# Patient Record
Sex: Female | Born: 1975 | Race: White | Hispanic: Yes | Marital: Married | Smoking: Never smoker
Health system: Southern US, Community
[De-identification: ages and names within clinical notes are randomized; demographics above are authoritative.]

---

## 1999-12-29 ENCOUNTER — Ambulatory Visit (HOSPITAL_COMMUNITY): Admission: RE | Admit: 1999-12-29 | Discharge: 1999-12-29 | Payer: Self-pay | Admitting: *Deleted

## 2000-05-20 ENCOUNTER — Encounter (HOSPITAL_COMMUNITY): Admission: RE | Admit: 2000-05-20 | Discharge: 2000-05-24 | Payer: Self-pay | Admitting: Obstetrics & Gynecology

## 2000-05-25 ENCOUNTER — Inpatient Hospital Stay (HOSPITAL_COMMUNITY): Admission: AD | Admit: 2000-05-25 | Discharge: 2000-05-28 | Payer: Self-pay | Admitting: *Deleted

## 2000-07-13 ENCOUNTER — Emergency Department (HOSPITAL_COMMUNITY): Admission: EM | Admit: 2000-07-13 | Discharge: 2000-07-13 | Payer: Self-pay | Admitting: Emergency Medicine

## 2005-03-17 ENCOUNTER — Inpatient Hospital Stay (HOSPITAL_COMMUNITY): Admission: EM | Admit: 2005-03-17 | Discharge: 2005-03-20 | Payer: Self-pay | Admitting: Emergency Medicine

## 2017-07-17 ENCOUNTER — Other Ambulatory Visit: Payer: Self-pay

## 2017-07-17 ENCOUNTER — Emergency Department (HOSPITAL_COMMUNITY): Payer: Self-pay

## 2017-07-17 ENCOUNTER — Emergency Department (HOSPITAL_COMMUNITY)
Admission: EM | Admit: 2017-07-17 | Discharge: 2017-07-17 | Disposition: A | Discharge status: Home / Self Care | Payer: Self-pay | Attending: Emergency Medicine | Admitting: Emergency Medicine

## 2017-07-17 ENCOUNTER — Encounter (HOSPITAL_COMMUNITY): Payer: Self-pay | Admitting: Emergency Medicine

## 2017-07-17 DIAGNOSIS — R112 Nausea with vomiting, unspecified: Secondary | ICD-10-CM | POA: Insufficient documentation

## 2017-07-17 DIAGNOSIS — R0789 Other chest pain: Secondary | ICD-10-CM | POA: Insufficient documentation

## 2017-07-17 DIAGNOSIS — K2900 Acute gastritis without bleeding: Secondary | ICD-10-CM | POA: Insufficient documentation

## 2017-07-17 LAB — BASIC METABOLIC PANEL
Anion gap: 10 (ref 5–15)
BUN: 6 mg/dL (ref 6–20)
CHLORIDE: 100 mmol/L — AB (ref 101–111)
CO2: 24 mmol/L (ref 22–32)
CREATININE: 0.51 mg/dL (ref 0.44–1.00)
Calcium: 9.6 mg/dL (ref 8.9–10.3)
Glucose, Bld: 129 mg/dL — ABNORMAL HIGH (ref 65–99)
POTASSIUM: 3.8 mmol/L (ref 3.5–5.1)
SODIUM: 134 mmol/L — AB (ref 135–145)

## 2017-07-17 LAB — I-STAT BETA HCG BLOOD, ED (MC, WL, AP ONLY)

## 2017-07-17 LAB — CBC
HCT: 38.1 % (ref 36.0–46.0)
Hemoglobin: 12.9 g/dL (ref 12.0–15.0)
MCH: 30.1 pg (ref 26.0–34.0)
MCHC: 33.9 g/dL (ref 30.0–36.0)
MCV: 88.8 fL (ref 78.0–100.0)
PLATELETS: 364 10*3/uL (ref 150–400)
RBC: 4.29 MIL/uL (ref 3.87–5.11)
RDW: 13.9 % (ref 11.5–15.5)
WBC: 14 10*3/uL — AB (ref 4.0–10.5)

## 2017-07-17 LAB — I-STAT TROPONIN, ED
Troponin i, poc: 0 ng/mL (ref 0.00–0.08)
Troponin i, poc: 0 ng/mL (ref 0.00–0.08)

## 2017-07-17 MED ORDER — ONDANSETRON 4 MG PO TBDP
4.0000 mg | ORAL_TABLET | Freq: Once | ORAL | Status: AC
  Administered 2017-07-17: 4 mg via ORAL
  Filled 2017-07-17: qty 1

## 2017-07-17 MED ORDER — PANTOPRAZOLE SODIUM 20 MG PO TBEC: 20.0000 mg | DELAYED_RELEASE_TABLET | Freq: Every day | ORAL | 0 refills | Status: DC

## 2017-07-17 MED ORDER — RANITIDINE HCL 150 MG PO CAPS: 150.0000 mg | ORAL_CAPSULE | Freq: Every day | ORAL | 0 refills | Status: DC

## 2017-07-17 MED ORDER — GI COCKTAIL ~~LOC~~
30.0000 mL | Freq: Once | ORAL | Status: AC
  Administered 2017-07-17: 30 mL via ORAL
  Filled 2017-07-17: qty 30

## 2017-07-17 NOTE — ED Triage Notes (Signed)
Pt reports epigastric with n/v that started last night that feels like her stomach is 'full of air".

## 2017-07-17 NOTE — Discharge Instructions (Signed)
Your pain is likely from acid reflux, gastritis or an ulcer. You have low risk for cardiac problems.   Take medications as prescribed protonix once daily zantac one daily Carafate suspension with every meal.   Try to modify your diet. See diet attached.   Sometimes a bacterial infection (H. pylori) can cause gastritis and ulcers. Follow up with gastroenterologist if your symptoms do not improve in 2 weeks.   Return to ED if you have chest pain, shortness of breath, fevers, blood in vomit or feces   Su dolor es probable de reflujo cido, gastritis o una lcera. Tiene bajo riesgo de problemas cardacos.   Tome los [employer] que se le han recetado protonix una vez al da zantac uno diariamente suspensin de carafate con cada comida.   Intente modificar su dieta.   Una infeccin bacteriana (H. pylori) puede causar gastritis y lceras. Haga un seguimiento con un gastroenterlogo si sus sntomas no mejoran en 2 semanas. Regrese a la sala de emergencia si tiene Kohl'sdolor en el pecho, dificultad para respirar, fiebre, o sangre en vmito o heces

## 2017-07-17 NOTE — ED Provider Notes (Signed)
[name] EMERGENCY DEPARTMENT Provider Note   CSN: [id number] Arrival date & time: 07/17/17  1191     History   Chief Complaint Chief Complaint  Patient presents with  . Chest Pain    HPI Stephanie Maynard is a 41 y.o. female with no past medical history presents to ED for evaluation of pain to "pit of my stomach" onset 10PM yesterday, one hour after eating dinner. Feels like it is going deeper into her back. Describes the pain as "my stomach is full of air", burning and a tight pressure. Associated with nausea, vomiting, feeling full and increased belching and indigestion. Has had similar symptoms in the past which have resolved on their own. Tried drinking milk without help. Take ibuprofen at least three times a week for headaches. Denies heavy ETOH use or previous h/o PUD. No cardiac history, HLD, HTN, diabetes or h/o DVT/PE.   Denies fevers, chills, palpitations, LE edema, shortness of breath, diarrhea, constipation. Reports intermittent darker urine but no dysuria, abnormal vaginal discharge or bleeding.   HPI  History reviewed. No pertinent past medical history.  There are no active problems to display for this patient.   History reviewed. No pertinent surgical history.  OB History    No data available       Home Medications    Prior to Admission medications   Medication Sig Start Date End Date Taking? Authorizing Provider  pantoprazole (PROTONIX) 20 MG tablet Take 1 tablet (20 mg total) by mouth daily. 07/17/17   Liberty Handy, PA-C  ranitidine (ZANTAC) 150 MG capsule Take 1 capsule (150 mg total) by mouth daily. 07/17/17   Liberty Handy, PA-C    Family History No family history on file.  Social History Social History   Tobacco Use  . Smoking status: Not on file  Substance Use Topics  . Alcohol use: Not on file  . Drug use: Not on file     Allergies   Patient has no known allergies.   Review of  Systems Review of Systems  Cardiovascular: Positive for chest pain.  Gastrointestinal: Positive for abdominal pain, nausea and vomiting.  All other systems reviewed and are negative.    Physical Exam Updated Vital Signs BP 130/83   Pulse 85   Temp 98 F (36.7 C) (Oral)   Resp (!) 24   Ht 5\' 3"  (1.6 m)   Wt 124.7 kg (275 lb)   SpO2 100%   BMI 48.71 kg/m   Physical Exam  Constitutional: She appears well-developed and well-nourished.  NAD.  HENT:  Head: Normocephalic and atraumatic.  Nose: Nose normal.  Moist mucous membranes Tonsils and oropharynx normal  Eyes: Conjunctivae, EOM and lids are normal.  Neck: Trachea normal and normal range of motion.  Neck is supple Trachea midline No cervical adenopathy  Cardiovascular: Normal rate, regular rhythm, S1 normal, S2 normal and normal heart sounds.  Pulses:      Carotid pulses are 2+ on the right side, and 2+ on the left side.      Radial pulses are 2+ on the right side, and 2+ on the left side.       Dorsalis pedis pulses are 2+ on the right side, and 2+ on the left side.  RRR No S3 No orthopnea No LE edema No carotid bruits  Pulmonary/Chest: Effort normal and breath sounds normal. No respiratory distress. She has no decreased breath sounds. She has no wheezes. She has no rhonchi. She has no rales.  No chest wall tenderness CP not reproducible with AROM of upper extremities  Abdominal: Soft. Bowel sounds are normal. There is tenderness in the epigastric area.  Suprapubic or CVAT. No G/R/R.   Lymphadenopathy:    She has no cervical adenopathy.  Neurological: She is alert. GCS eye subscore is 4. GCS verbal subscore is 5. GCS motor subscore is 6.  Skin: Skin is warm and dry. Capillary refill takes less than 2 seconds.  Psychiatric: She has a normal mood and affect. Her speech is normal and behavior is normal. Judgment and thought content normal. Cognition and memory are normal.     ED Treatments / Results  Labs (all  labs ordered are listed, but only abnormal results are displayed) Labs Reviewed  BASIC METABOLIC PANEL - Abnormal; Notable for the following components:      Result Value   Sodium 134 (*)    Chloride 100 (*)    Glucose, Bld 129 (*)    All other components within normal limits  CBC - Abnormal; Notable for the following components:   WBC 14.0 (*)    All other components within normal limits  URINALYSIS, ROUTINE W REFLEX MICROSCOPIC  I-STAT TROPONIN, ED  I-STAT BETA HCG BLOOD, ED ([facility], [facility], [facility] ONLY)  I-STAT TROPONIN, ED  POC URINE PREG, ED    EKG  EKG Interpretation  Date/Time:  Wednesday July 17 2017 09:18:10 EST Ventricular Rate:  98 PR Interval:  134 QRS Duration: 102 QT Interval:  346 QTC Calculation: 441 R Axis:   113 Text Interpretation:  Normal sinus rhythm Left posterior fascicular block Abnormal ECG No old tracing to compare Confirmed by Pricilla LovelessGoldston, Scott [phone] on 07/17/2017 11:53:08 AM       Radiology Dg Chest 2 View  Result Date: 07/17/2017 CLINICAL DATA:  Epigastric abdominal pain.  Nausea and vomiting. EXAM: CHEST  2 VIEW COMPARISON:  03/17/2005 FINDINGS: Examination is minimally degraded due to patient body habitus. Normal cardiac silhouette and mediastinal contours. No focal parenchymal opacities. No pleural effusion or pneumothorax. No evidence of edema. No acute osseus abnormalities. There is no significant gaseous distention of the stomach. IMPRESSION: No acute cardiopulmonary disease. No significant gaseous distention of the stomach. Electronically Signed   By: Simonne ComeJohn  Watts M.D.   On: 07/17/2017 09:49    Procedures Procedures (including critical care time)  Medications Ordered in ED Medications  ondansetron (ZOFRAN-ODT) disintegrating tablet 4 mg (4 mg Oral Given 07/17/17 1340)  gi cocktail (Maalox,Lidocaine,Donnatal) (30 mLs Oral Given 07/17/17 1340)     Initial Impression / Assessment and Plan / ED Course  I have reviewed the triage vital signs  and the nursing notes.  Pertinent labs & imaging results that were available during my care of the patient were reviewed by me and considered in my medical decision making (see chart for details).  Clinical Course as of Jul 18 1507  Wed Jul 17, 2017  1417 Re-evaluated pt. Reports significant improvement in pain. No longer TTP in epigastrum on repeat exam. Pending delta trop   [CG]    Clinical Course User Index [CG] Liberty HandyGibbons, Claudia J, PA-C    Pt is a 41 y.o. female presents with CP/epigastric discomfort described as burning pressure. Symptoms have been constant and started one hour after dinner..  Pertinent risk factors include  none.  On exam VS are wnl. Cardiovascular and pulmonary exam benign. CXR, EKG, troponin x 2 within normal limits.  CBC and BMP unremarkable.  PERC negative.  Pt was given GI cocktail  and zofran in ED with significant improvement in symptoms. Patient has ambulated and tolerated PO in ED. Given symptoms, reassuring ED work up, low risk HEART score patient will be discharged with recommendation to follow up with PCP and cardiologist in regards to today's hospital visit. High suspicion for GERD/PUD. She takes a lot of ibuprofen for chronic at least three times a week. Will d/c with protonix, zantac and carafate. ED return preacutions given. Pt appears reliable for follow up and is agreeable to discharge.    Final Clinical Impressions(s) / ED Diagnoses   Final diagnoses:  Atypical chest pain  Acute gastritis without hemorrhage, unspecified gastritis type    ED Discharge Orders        Ordered    pantoprazole (PROTONIX) 20 MG tablet  Daily     07/17/17 1458    ranitidine (ZANTAC) 150 MG capsule  Daily     07/17/17 1458       Liberty HandyGibbons, Claudia J, PA-C 07/17/17 1508    Pricilla LovelessGoldston, Scott, MD 07/17/17 [phone]

## 2017-10-06 ENCOUNTER — Encounter (HOSPITAL_COMMUNITY): Payer: Self-pay

## 2017-10-06 ENCOUNTER — Inpatient Hospital Stay (HOSPITAL_COMMUNITY)
Admission: EM | Admit: 2017-10-06 | Discharge: 2017-10-08 | DRG: 419 | Disposition: A | Discharge status: Home / Self Care | Payer: Medicaid Other | Attending: General Surgery | Admitting: General Surgery

## 2017-10-06 ENCOUNTER — Emergency Department (HOSPITAL_COMMUNITY): Payer: Medicaid Other

## 2017-10-06 ENCOUNTER — Other Ambulatory Visit: Payer: Self-pay

## 2017-10-06 DIAGNOSIS — K8000 Calculus of gallbladder with acute cholecystitis without obstruction: Secondary | ICD-10-CM | POA: Diagnosis present

## 2017-10-06 DIAGNOSIS — K8020 Calculus of gallbladder without cholecystitis without obstruction: Secondary | ICD-10-CM

## 2017-10-06 DIAGNOSIS — K810 Acute cholecystitis: Secondary | ICD-10-CM | POA: Diagnosis present

## 2017-10-06 LAB — URINALYSIS, ROUTINE W REFLEX MICROSCOPIC
Bacteria, UA: NONE SEEN
Bilirubin Urine: NEGATIVE
Glucose, UA: NEGATIVE mg/dL
Ketones, ur: 80 mg/dL — AB
Nitrite: NEGATIVE
Protein, ur: 30 mg/dL — AB
Specific Gravity, Urine: 1.017 (ref 1.005–1.030)
pH: 6 (ref 5.0–8.0)

## 2017-10-06 LAB — I-STAT CG4 LACTIC ACID, ED
Lactic Acid, Venous: 1.7 mmol/L (ref 0.5–1.9)
Lactic Acid, Venous: 0.92 mmol/L (ref 0.5–1.9)

## 2017-10-06 LAB — COMPREHENSIVE METABOLIC PANEL
ALT: 33 U/L (ref 14–54)
AST: 38 U/L (ref 15–41)
Albumin: 4 g/dL (ref 3.5–5.0)
Alkaline Phosphatase: 94 U/L (ref 38–126)
Anion gap: 12 (ref 5–15)
BUN: <5 mg/dL — ABNORMAL LOW (ref 6–20)
CO2: 22 mmol/L (ref 22–32)
Calcium: 8.9 mg/dL (ref 8.9–10.3)
Chloride: 99 mmol/L — ABNORMAL LOW (ref 101–111)
Creatinine, Ser: 0.56 mg/dL (ref 0.44–1.00)
GFR calc Af Amer: >60 mL/min (ref >60)
GFR calc non Af Amer: >60 mL/min (ref >60)
Glucose, Bld: 137 mg/dL — ABNORMAL HIGH (ref 65–99)
Potassium: 3.7 mmol/L (ref 3.5–5.1)
Sodium: 133 mmol/L — ABNORMAL LOW (ref 135–145)
Total Bilirubin: 1.9 mg/dL — ABNORMAL HIGH (ref 0.3–1.2)
Total Protein: 7.5 g/dL (ref 6.5–8.1)

## 2017-10-06 LAB — CBC
HCT: 37.8 % (ref 36.0–46.0)
HCT: 34.3 % — ABNORMAL LOW (ref 36.0–46.0)
HEMOGLOBIN: 11.2 g/dL — AB (ref 12.0–15.0)
Hemoglobin: 12.8 g/dL (ref 12.0–15.0)
MCH: 29.8 pg (ref 26.0–34.0)
MCH: 28.7 pg (ref 26.0–34.0)
MCHC: 33.9 g/dL (ref 30.0–36.0)
MCHC: 32.7 g/dL (ref 30.0–36.0)
MCV: 88.1 fL (ref 78.0–100.0)
MCV: 87.9 fL (ref 78.0–100.0)
Platelets: 360 10*3/uL (ref 150–400)
Platelets: 262 10*3/uL (ref 150–400)
RBC: 4.29 MIL/uL (ref 3.87–5.11)
RBC: 3.9 MIL/uL (ref 3.87–5.11)
RDW: 14.1 % (ref 11.5–15.5)
RDW: 14.1 % (ref 11.5–15.5)
WBC: 17.5 10*3/uL — ABNORMAL HIGH (ref 4.0–10.5)
WBC: 13.8 10*3/uL — ABNORMAL HIGH (ref 4.0–10.5)

## 2017-10-06 LAB — CREATININE, SERUM
CREATININE: 0.55 mg/dL (ref 0.44–1.00)
GFR calc Af Amer: >60 mL/min (ref >60)

## 2017-10-06 LAB — I-STAT BETA HCG BLOOD, ED (MC, WL, AP ONLY): I-stat hCG, quantitative: <5 m[IU]/mL (ref <5)

## 2017-10-06 LAB — LIPASE, BLOOD: Lipase: 28 U/L (ref 11–51)

## 2017-10-06 MED ORDER — KCL IN DEXTROSE-NACL 20-5-0.45 MEQ/L-%-% IV SOLN
INTRAVENOUS | Status: DC
  Administered 2017-10-06 – 2017-10-07 (×2): via INTRAVENOUS
  Administered 2017-10-08: 75 mL/h via INTRAVENOUS
  Filled 2017-10-06 (×3): qty 1000

## 2017-10-06 MED ORDER — IOPAMIDOL (ISOVUE-300) INJECTION 61%
INTRAVENOUS | Status: AC
  Filled 2017-10-06: qty 100

## 2017-10-06 MED ORDER — SODIUM CHLORIDE 0.9 % IV SOLN
2.0000 g | INTRAVENOUS | Status: DC
  Administered 2017-10-07 (×2): 2 g via INTRAVENOUS
  Filled 2017-10-06: qty 20

## 2017-10-06 MED ORDER — METRONIDAZOLE IVPB CUSTOM
1.0000 g | Freq: Once | INTRAVENOUS | Status: AC
  Administered 2017-10-06: 19:00:00 1 g via INTRAVENOUS
  Filled 2017-10-06: qty 200

## 2017-10-06 MED ORDER — DIPHENHYDRAMINE HCL 50 MG/ML IJ SOLN: 25.0000 mg | Freq: Four times a day (QID) | INTRAMUSCULAR | Status: DC | PRN

## 2017-10-06 MED ORDER — ACETAMINOPHEN 325 MG PO TABS
650.0000 mg | ORAL_TABLET | Freq: Once | ORAL | Status: AC
  Administered 2017-10-06: 650 mg via ORAL
  Filled 2017-10-06: qty 2

## 2017-10-06 MED ORDER — HYDROMORPHONE HCL 1 MG/ML IJ SOLN
0.6000 mg | Freq: Once | INTRAMUSCULAR | Status: AC
  Administered 2017-10-06: 0.6 mg via INTRAVENOUS
  Filled 2017-10-06: qty 1

## 2017-10-06 MED ORDER — ACETAMINOPHEN 325 MG PO TABS
650.0000 mg | ORAL_TABLET | Freq: Four times a day (QID) | ORAL | Status: DC | PRN
  Administered 2017-10-07: 650 mg via ORAL
  Filled 2017-10-06: qty 2

## 2017-10-06 MED ORDER — SODIUM CHLORIDE 0.9 % IV BOLUS (SEPSIS)
1000.0000 mL | Freq: Once | INTRAVENOUS | Status: AC
  Administered 2017-10-06: 1000 mL via INTRAVENOUS

## 2017-10-06 MED ORDER — MORPHINE SULFATE (PF) 4 MG/ML IV SOLN
2.0000 mg | INTRAVENOUS | Status: DC | PRN
  Administered 2017-10-07: 2 mg via INTRAVENOUS
  Filled 2017-10-06 (×2): qty 1

## 2017-10-06 MED ORDER — HYDROMORPHONE HCL 1 MG/ML IJ SOLN
0.5000 mg | Freq: Once | INTRAMUSCULAR | Status: AC
  Administered 2017-10-06: 0.5 mg via INTRAVENOUS
  Filled 2017-10-06: qty 1

## 2017-10-06 MED ORDER — ONDANSETRON HCL 4 MG/2ML IJ SOLN: 4.0000 mg | Freq: Four times a day (QID) | INTRAMUSCULAR | Status: DC | PRN

## 2017-10-06 MED ORDER — FAMOTIDINE IN NACL 20-0.9 MG/50ML-% IV SOLN
20.0000 mg | Freq: Once | INTRAVENOUS | Status: AC
  Administered 2017-10-06: 20 mg via INTRAVENOUS
  Filled 2017-10-06: qty 50

## 2017-10-06 MED ORDER — OXYCODONE HCL 5 MG PO TABS
5.0000 mg | ORAL_TABLET | ORAL | Status: DC | PRN
  Administered 2017-10-08: 5 mg via ORAL
  Filled 2017-10-06: qty 1

## 2017-10-06 MED ORDER — ONDANSETRON HCL 4 MG/2ML IJ SOLN
4.0000 mg | Freq: Once | INTRAMUSCULAR | Status: AC
  Administered 2017-10-06: 4 mg via INTRAVENOUS
  Filled 2017-10-06: qty 2

## 2017-10-06 MED ORDER — ACETAMINOPHEN 650 MG RE SUPP: 650.0000 mg | Freq: Four times a day (QID) | RECTAL | Status: DC | PRN

## 2017-10-06 MED ORDER — SODIUM CHLORIDE 0.9 % IV SOLN
2.0000 g | Freq: Once | INTRAVENOUS | Status: AC
  Administered 2017-10-06: 2 g via INTRAVENOUS
  Filled 2017-10-06: qty 20

## 2017-10-06 MED ORDER — ENOXAPARIN SODIUM 40 MG/0.4ML ~~LOC~~ SOLN: 40.0000 mg | SUBCUTANEOUS | Status: DC

## 2017-10-06 MED ORDER — ACETAMINOPHEN 325 MG PO TABS: 650.0000 mg | ORAL_TABLET | Freq: Once | ORAL | Status: DC

## 2017-10-06 MED ORDER — ONDANSETRON 4 MG PO TBDP: 4.0000 mg | ORAL_TABLET | Freq: Four times a day (QID) | ORAL | Status: DC | PRN

## 2017-10-06 MED ORDER — METOPROLOL TARTRATE 5 MG/5ML IV SOLN: 5.0000 mg | Freq: Four times a day (QID) | INTRAVENOUS | Status: DC | PRN

## 2017-10-06 MED ORDER — DIPHENHYDRAMINE HCL 25 MG PO CAPS: 25.0000 mg | ORAL_CAPSULE | Freq: Four times a day (QID) | ORAL | Status: DC | PRN

## 2017-10-06 NOTE — ED Notes (Signed)
ED Provider at bedside. 

## 2017-10-06 NOTE — H&P (Signed)
Stephanie Maynard is an 42 y.o. female.   Chief Complaint: abdominal pain HPI: 42 yo female with 2 days of epigastric abdominal pain. She had one previous bout in December. The pain has been constant. It radiates to her back. It came out of no where. It is not related to food. She has had nausea and vomiting. She denies diarrhea or constipation. She denies fevers.  History reviewed. No pertinent past medical history.  History reviewed. No pertinent surgical history.  No family history on file. Social History:  reports that  has never smoked. she has never used smokeless tobacco. She reports that she does not drink alcohol or use drugs.  Allergies: No Known Allergies   (Not in a hospital admission)  Results for orders placed or performed during the hospital encounter of 10/06/17 (from the past 48 hour(s))  Lipase, blood     Status: None   Collection Time: 10/06/17 10:50 AM  Result Value Ref Range   Lipase 28 11 - 51 U/L    Comment: Performed at [facility], [address], [place], [place] [address]  Comprehensive metabolic panel     Status: Abnormal   Collection Time: 10/06/17 10:50 AM  Result Value Ref Range   Sodium 133 (L) 135 - 145 mmol/L   Potassium 3.7 3.5 - 5.1 mmol/L   Chloride 99 (L) 101 - 111 mmol/L   CO2 22 22 - 32 mmol/L   Glucose, Bld 137 (H) 65 - 99 mg/dL   BUN <5 (L) 6 - 20 mg/dL   Creatinine, Ser 0.56 0.44 - 1.00 mg/dL   Calcium 8.9 8.9 - 10.3 mg/dL   Total Protein 7.5 6.5 - 8.1 g/dL   Albumin 4.0 3.5 - 5.0 g/dL   AST 38 15 - 41 U/L   ALT 33 14 - 54 U/L   Alkaline Phosphatase 94 38 - 126 U/L   Total Bilirubin 1.9 (H) 0.3 - 1.2 mg/dL   GFR calc non Af Amer >60 >60 mL/min   GFR calc Af Amer >60 >60 mL/min    Comment: (NOTE) The eGFR has been calculated using the CKD EPI equation. This calculation has not been validated in all clinical situations. eGFR's persistently <60 mL/min signify possible Chronic Kidney Disease.    Anion gap 12 5 -  15    Comment: Performed at [facility] [address], [place], [place] [address]  CBC     Status: Abnormal   Collection Time: 10/06/17 10:50 AM  Result Value Ref Range   WBC 17.5 (H) 4.0 - 10.5 K/uL   RBC 4.29 3.87 - 5.11 MIL/uL   Hemoglobin 12.8 12.0 - 15.0 g/dL   HCT 37.8 36.0 - 46.0 %   MCV 88.1 78.0 - 100.0 fL   MCH 29.8 26.0 - 34.0 pg   MCHC 33.9 30.0 - 36.0 g/dL   RDW 14.1 11.5 - 15.5 %   Platelets 360 150 - 400 K/uL    Comment: Performed at [facility], [place] [address], [place],  [address]  Urinalysis, Routine w reflex microscopic     Status: Abnormal   Collection Time: 10/06/17 10:51 AM  Result Value Ref Range   Color, Urine AMBER (A) YELLOW    Comment: BIOCHEMICALS MAY BE AFFECTED BY COLOR   APPearance HAZY (A) CLEAR   Specific Gravity, Urine 1.017 1.005 - 1.030   pH 6.0 5.0 - 8.0   Glucose, UA NEGATIVE NEGATIVE mg/dL   Hgb urine dipstick SMALL (  A) NEGATIVE   Bilirubin Urine NEGATIVE NEGATIVE   Ketones, ur 80 (A) NEGATIVE mg/dL   Protein, ur 30 (A) NEGATIVE mg/dL   Nitrite NEGATIVE NEGATIVE   Leukocytes, UA TRACE (A) NEGATIVE   RBC / HPF 0-5 0 - 5 RBC/hpf   WBC, UA 0-5 0 - 5 WBC/hpf   Bacteria, UA NONE SEEN NONE SEEN   Squamous Epithelial / LPF 6-30 (A) NONE SEEN   Mucus PRESENT     Comment: Performed at [facility], [place] [address], [place], [place] [address]  I-Stat beta hCG blood, ED     Status: None   Collection Time: 10/06/17 11:05 AM  Result Value Ref Range   I-stat hCG, quantitative <5.0 <5 mIU/mL   Comment 3            Comment:   GEST. AGE      CONC.  (mIU/mL)   <=1 WEEK        5 - 50     2 WEEKS       50 - 500     3 WEEKS       100 - 10,000     4 WEEKS     1,000 - 30,000        FEMALE AND NON-PREGNANT FEMALE:     LESS THAN 5 mIU/mL   I-Stat CG4 Lactic Acid, ED     Status: None   Collection Time: 10/06/17 11:07 AM  Result Value Ref Range   Lactic Acid, Venous 1.70 0.5 - 1.9 mmol/L  I-Stat CG4 Lactic Acid, ED     Status:  None   Collection Time: 10/06/17  1:29 PM  Result Value Ref Range   Lactic Acid, Venous 0.92 0.5 - 1.9 mmol/L   Ct Abdomen Pelvis W Contrast  Result Date: 10/06/2017 CLINICAL DATA:  42 year old female with epigastric pain since last night. Vomiting and hematuria. Fever. EXAM: CT ABDOMEN AND PELVIS WITH CONTRAST TECHNIQUE: Multidetector CT imaging of the abdomen and pelvis was performed using the standard protocol following bolus administration of intravenous contrast. CONTRAST:  80 milliliters Omnipaque 300 COMPARISON:  Chest radiographs 07/17/2017. FINDINGS: Lower chest: Minor atelectasis in the right lower lobe. Small calcified granulomas in the right middle lobe. Negative left lung base. No pericardial or pleural effusion. Hepatobiliary: Generalized hepatic steatosis. Mild hyperenhancement of the liver at the gallbladder fossa. Bulky 3.2 centimeter lamellated gallstone. Diffuse gallbladder wall thickening or pericholecystic edema (coronal image 91). No intra-or extrahepatic biliary ductal enlargement. Pancreas: Negative pancreas.  No pancreatic ductal enlargement. Spleen: Negative. Adrenals/Urinary Tract: Normal adrenal glands. Bilateral renal enhancement and contrast excretion is symmetric and within normal limits. No perinephric stranding. Unremarkable urinary bladder. Stomach/Bowel: Redundant sigmoid colon. Gas-filled but nondilated descending and transverse colon. Retained stool in the right colon. No large bowel wall thickening. The appendix is within normal limits (series 3, image 62). Decompressed and negative terminal ileum. Small lipoma at the ileocecal valve suspected (inconsequential). No dilated small bowel. Decompressed stomach and duodenum. No abdominal free air or free fluid. Vascular/Lymphatic: Major arterial structures in the abdomen and pelvis are patent. There is minimal iliac artery atherosclerosis. Portal venous system appears patent. Reproductive: Negative. Other: Trace pelvic free  fluid. Musculoskeletal: Mild lower thoracic spine degeneration. No acute osseous abnormality identified. IMPRESSION: 1. Positive for Acute Cholecystitis. Moderate to severe gallbladder wall thickening and 3.2 cm gallstone in the gallbladder neck. No biliary ductal dilatation. 2. Hepatic steatosis. Electronically Signed   By: Herminio Heads.D.  On: 10/06/2017 18:58    Review of Systems  Constitutional: Negative for chills and fever.  HENT: Negative for hearing loss.   Eyes: Negative for blurred vision and double vision.  Respiratory: Negative for cough and hemoptysis.   Cardiovascular: Negative for chest pain and palpitations.  Gastrointestinal: Positive for abdominal pain, nausea and vomiting.  Genitourinary: Negative for dysuria and urgency.  Musculoskeletal: Negative for myalgias and neck pain.  Skin: Negative for itching and rash.  Neurological: Negative for dizziness, tingling and headaches.  Endo/Heme/Allergies: Does not bruise/bleed easily.  Psychiatric/Behavioral: Negative for depression and suicidal ideas.    Blood pressure 122/80, pulse 99, temperature (!) 100.5 F (38.1 C), resp. rate 20, last menstrual period 09/18/2017, SpO2 100 %. Physical Exam  Vitals reviewed. Constitutional: She is oriented to person, place, and time. She appears well-developed and well-nourished.  HENT:  Head: Normocephalic and atraumatic.  Eyes: Conjunctivae and EOM are normal. Pupils are equal, round, and reactive to light.  Neck: Normal range of motion. Neck supple.  Cardiovascular: Normal rate and regular rhythm.  Respiratory: Effort normal and breath sounds normal.  GI: Soft. Bowel sounds are normal. She exhibits no distension. There is tenderness in the right upper quadrant and epigastric area. There is positive Murphy's sign.  Musculoskeletal: Normal range of motion.  Neurological: She is alert and oriented to person, place, and time.  Skin: Skin is warm and dry.  Psychiatric: She has a normal  mood and affect. Her behavior is normal.     Assessment/Plan 42 yo female with epigastric abdominal pain and stones and leukocytosis consistent with acute calculous cholecystitis. Of note she has a total bilirubin of 1.9. -admit to surgery -repeat LFTs in am, if bilirubin uptrends will seek GI evaluation -otherwise plan for lap chole during hospitalization -abx -pain control -We discussed the etiology of her pain, we discussed treatment options and recommended surgery. We discussed details of surgery including general anesthesia, laparoscopic approach, identification of cystic duct and common bile duct. Ligation of cystic duct and cystic artery. Possible need for intraoperative cholangiogram or open procedure. Possible risks of common bile duct injury, liver injury, cystic duct leak, bleeding, infection, post-cholecystectomy syndrome. The patient showed good understanding and all questions were answered   Mickeal Skinner, MD 10/06/2017, 7:43 PM

## 2017-10-06 NOTE — ED Provider Notes (Signed)
[facility] EMERGENCY DEPARTMENT Provider Note   CSN: [id number] Arrival date & time: 10/06/17  1037     History   Chief Complaint Chief Complaint  Patient presents with  . Abdominal Pain    HPI Stephanie Maynard is a 42 y.o. female.  HPI   42 year old female with abdominal pain.  She is primarily Spanish-speaking.  Interpreter service was used for history.  She began having abdominal pain last night.  Pain is epigastric.  Has been constant since she first noticed it.  She has been anorexic.  Nausea and vomiting.  Subjective fever.  No diarrhea.  No sick contacts.  She has not tried taking anything for her symptoms.  She reports similar symptoms this past December.  She reports she was seen in the emergency room and diagnosed with gastritis.  Her symptoms resolved and she has not had recurrent ones until yesterday.  Denies any past history of reflux or PUD.  Only occasional alcohol use denies regular NSAID usage.  Denies any past abdominal surgery.  History reviewed. No pertinent past medical history.  There are no active problems to display for this patient.   History reviewed. No pertinent surgical history.  OB History    No data available       Home Medications    Prior to Admission medications   Medication Sig Start Date End Date Taking? Authorizing Provider  DM-Doxylamine-Acetaminophen (NYQUIL HBP COLD & FLU) 15-6.25-325 MG/15ML LIQD Take 15 mLs by mouth at bedtime as needed (sleep).   Yes [provider]  ibuprofen (ADVIL,MOTRIN) 200 MG tablet Take 200 mg by mouth every 6 (six) hours as needed for moderate pain.   Yes [provider]  pantoprazole (PROTONIX) 20 MG tablet Take 1 tablet (20 mg total) by mouth daily. Patient not taking: Reported on 10/06/2017 07/17/17   Liberty Handy, PA-C  ranitidine (ZANTAC) 150 MG capsule Take 1 capsule (150 mg total) by mouth daily. Patient not taking: Reported on 10/06/2017  07/17/17   Liberty Handy, PA-C    Family History No family history on file.  Social History Social History   Tobacco Use  . Smoking status: Never Smoker  . Smokeless tobacco: Never Used  Substance Use Topics  . Alcohol use: No    Frequency: Never  . Drug use: No     Allergies   Patient has no known allergies.   Review of Systems Review of Systems  All systems reviewed and negative, other than as noted in HPI.  Physical Exam Updated Vital Signs BP 120/83   Pulse 91   Temp (!) 100.5 F (38.1 C)   Resp 20   LMP 09/18/2017   SpO2 99%   Physical Exam  Constitutional: She appears well-developed and well-nourished. No distress.  HENT:  Head: Normocephalic and atraumatic.  Eyes: Conjunctivae are normal. Right eye exhibits no discharge. Left eye exhibits no discharge.  Neck: Neck supple.  Cardiovascular: Normal rate, regular rhythm and normal heart sounds. Exam reveals no gallop and no friction rub.  No murmur heard. Pulmonary/Chest: Effort normal and breath sounds normal. No respiratory distress.  Abdominal: Soft. She exhibits no distension. There is tenderness in the right upper quadrant, right lower quadrant and epigastric area. There is no rigidity, no rebound and no guarding.  Musculoskeletal: She exhibits no edema or tenderness.  Neurological: She is alert.  Skin: Skin is warm and dry.  Psychiatric: She has a normal mood and affect. Her behavior is normal. Thought content  normal.  Nursing note and vitals reviewed.    ED Treatments / Results  Labs (all labs ordered are listed, but only abnormal results are displayed) Labs Reviewed  COMPREHENSIVE METABOLIC PANEL - Abnormal; Notable for the following components:      Result Value   Sodium 133 (*)    Chloride 99 (*)    Glucose, Bld 137 (*)    BUN <5 (*)    Total Bilirubin 1.9 (*)    All other components within normal limits  CBC - Abnormal; Notable for the following components:   WBC 17.5 (*)    All  other components within normal limits  URINALYSIS, ROUTINE W REFLEX MICROSCOPIC - Abnormal; Notable for the following components:   Color, Urine AMBER (*)    APPearance HAZY (*)    Hgb urine dipstick SMALL (*)    Ketones, ur 80 (*)    Protein, ur 30 (*)    Leukocytes, UA TRACE (*)    Squamous Epithelial / LPF 6-30 (*)    All other components within normal limits  LIPASE, BLOOD  I-STAT BETA HCG BLOOD, ED ([facility], [facility], [facility] ONLY)  I-STAT CG4 LACTIC ACID, ED  I-STAT CG4 LACTIC ACID, ED    EKG  EKG Interpretation None       Radiology Ct Abdomen Pelvis W Contrast  Result Date: 10/06/2017 CLINICAL DATA:  42 year old female with epigastric pain since last night. Vomiting and hematuria. Fever. EXAM: CT ABDOMEN AND PELVIS WITH CONTRAST TECHNIQUE: Multidetector CT imaging of the abdomen and pelvis was performed using the standard protocol following bolus administration of intravenous contrast. CONTRAST:  80 milliliters Omnipaque 300 COMPARISON:  Chest radiographs 07/17/2017. FINDINGS: Lower chest: Minor atelectasis in the right lower lobe. Small calcified granulomas in the right middle lobe. Negative left lung base. No pericardial or pleural effusion. Hepatobiliary: Generalized hepatic steatosis. Mild hyperenhancement of the liver at the gallbladder fossa. Bulky 3.2 centimeter lamellated gallstone. Diffuse gallbladder wall thickening or pericholecystic edema (coronal image 91). No intra-or extrahepatic biliary ductal enlargement. Pancreas: Negative pancreas.  No pancreatic ductal enlargement. Spleen: Negative. Adrenals/Urinary Tract: Normal adrenal glands. Bilateral renal enhancement and contrast excretion is symmetric and within normal limits. No perinephric stranding. Unremarkable urinary bladder. Stomach/Bowel: Redundant sigmoid colon. Gas-filled but nondilated descending and transverse colon. Retained stool in the right colon. No large bowel wall thickening. The appendix is within normal limits (series  3, image 62). Decompressed and negative terminal ileum. Small lipoma at the ileocecal valve suspected (inconsequential). No dilated small bowel. Decompressed stomach and duodenum. No abdominal free air or free fluid. Vascular/Lymphatic: Major arterial structures in the abdomen and pelvis are patent. There is minimal iliac artery atherosclerosis. Portal venous system appears patent. Reproductive: Negative. Other: Trace pelvic free fluid. Musculoskeletal: Mild lower thoracic spine degeneration. No acute osseous abnormality identified. IMPRESSION: 1. Positive for Acute Cholecystitis. Moderate to severe gallbladder wall thickening and 3.2 cm gallstone in the gallbladder neck. No biliary ductal dilatation. 2. Hepatic steatosis. Electronically Signed   By: Odessa FlemingH  Hall M.D.   On: 10/06/2017 18:58    Procedures Procedures (including critical care time)  Medications Ordered in ED Medications  iopamidol (ISOVUE-300) 61 % injection (not administered)  metroNIDAZOLE (FLAGYL) IVPB 1 g (1 g Intravenous New Bag/Given 10/06/17 1848)  acetaminophen (TYLENOL) tablet 650 mg (650 mg Oral Given 10/06/17 1050)  HYDROmorphone (DILAUDID) injection 0.6 mg (0.6 mg Intravenous Given 10/06/17 1510)  ondansetron (ZOFRAN) injection 4 mg (4 mg Intravenous Given 10/06/17 1509)  sodium chloride 0.9 % bolus  1,000 mL (0 mLs Intravenous Stopped 10/06/17 1826)  famotidine (PEPCID) IVPB 20 mg premix (0 mg Intravenous Stopped 10/06/17 1637)  cefTRIAXone (ROCEPHIN) 2 g in sodium chloride 0.9 % 100 mL IVPB (2 g Intravenous New Bag/Given 10/06/17 1822)     Initial Impression / Assessment and Plan / ED Course  I have reviewed the triage vital signs and the nursing notes.  Pertinent labs & imaging results that were available during my care of the patient were reviewed by me and considered in my medical decision making (see chart for details).     42 year old female with epigastric pain.   CT is significant for cholecystitis.  Large gallstone  with gallbladder wall thickening and some pericholecystic fluid.  No biliary ductal dilation. She is febrile and has a leukocytosis.  LFTs/lipase are normal.  She was started on antibiotics.  I discussed these findings and the need for admission and likely subsequent surgery via translator.  Will consult surgery.  Final Clinical Impressions(s) / ED Diagnoses   Final diagnoses:  Calculus of gallbladder with acute cholecystitis without obstruction    ED Discharge Orders    None       Raeford Razor, MD 10/06/17 Ernestina [place]

## 2017-10-06 NOTE — ED Triage Notes (Signed)
Pt reports epigastric pain that started last night. Pt endorses vomiting and hematuria., Denies diarrhea

## 2017-10-06 NOTE — ED Notes (Signed)
Pt feels better  Pain better

## 2017-10-07 ENCOUNTER — Encounter (HOSPITAL_COMMUNITY): Admission: EM | Disposition: A | Discharge status: Home / Self Care | Payer: Self-pay | Source: Home / Self Care

## 2017-10-07 ENCOUNTER — Encounter (HOSPITAL_COMMUNITY): Payer: Self-pay | Admitting: Certified Registered"

## 2017-10-07 ENCOUNTER — Inpatient Hospital Stay (HOSPITAL_COMMUNITY): Payer: Medicaid Other

## 2017-10-07 ENCOUNTER — Inpatient Hospital Stay (HOSPITAL_COMMUNITY): Payer: Medicaid Other | Admitting: Certified Registered Nurse Anesthetist

## 2017-10-07 HISTORY — PX: CHOLECYSTECTOMY: SHX55

## 2017-10-07 LAB — COMPREHENSIVE METABOLIC PANEL
ALT: 49 U/L (ref 14–54)
ANION GAP: 9 (ref 5–15)
AST: 53 U/L — ABNORMAL HIGH (ref 15–41)
Albumin: 3.2 g/dL — ABNORMAL LOW (ref 3.5–5.0)
Alkaline Phosphatase: 97 U/L (ref 38–126)
BUN: <5 mg/dL — ABNORMAL LOW (ref 6–20)
CHLORIDE: 99 mmol/L — AB (ref 101–111)
CO2: 23 mmol/L (ref 22–32)
Calcium: 8.2 mg/dL — ABNORMAL LOW (ref 8.9–10.3)
Creatinine, Ser: 0.49 mg/dL (ref 0.44–1.00)
Glucose, Bld: 124 mg/dL — ABNORMAL HIGH (ref 65–99)
POTASSIUM: 3.6 mmol/L (ref 3.5–5.1)
SODIUM: 131 mmol/L — AB (ref 135–145)
Total Bilirubin: 1.7 mg/dL — ABNORMAL HIGH (ref 0.3–1.2)
Total Protein: 7 g/dL (ref 6.5–8.1)

## 2017-10-07 LAB — SURGICAL PCR SCREEN
MRSA, PCR: NEGATIVE
Staphylococcus aureus: NEGATIVE

## 2017-10-07 LAB — CBC
HEMATOCRIT: 32.7 % — AB (ref 36.0–46.0)
HEMOGLOBIN: 11 g/dL — AB (ref 12.0–15.0)
MCH: 29.7 pg (ref 26.0–34.0)
MCHC: 33.6 g/dL (ref 30.0–36.0)
MCV: 88.4 fL (ref 78.0–100.0)
Platelets: 318 10*3/uL (ref 150–400)
RBC: 3.7 MIL/uL — AB (ref 3.87–5.11)
RDW: 14.1 % (ref 11.5–15.5)
WBC: 15.2 10*3/uL — AB (ref 4.0–10.5)

## 2017-10-07 SURGERY — LAPAROSCOPIC CHOLECYSTECTOMY WITH INTRAOPERATIVE CHOLANGIOGRAM
Anesthesia: General | Site: Abdomen

## 2017-10-07 MED ORDER — KETOROLAC TROMETHAMINE 30 MG/ML IJ SOLN
30.0000 mg | Freq: Once | INTRAMUSCULAR | Status: DC | PRN
Start: 2017-10-07 — End: 2017-10-07
  Administered 2017-10-07: 30 mg via INTRAVENOUS

## 2017-10-07 MED ORDER — SODIUM CHLORIDE 0.9 % IR SOLN
Status: DC | PRN
  Administered 2017-10-07: 1

## 2017-10-07 MED ORDER — PROPOFOL 10 MG/ML IV BOLUS
INTRAVENOUS | Status: AC
  Filled 2017-10-07: qty 20

## 2017-10-07 MED ORDER — LACTATED RINGERS IV SOLN
INTRAVENOUS | Status: DC
  Administered 2017-10-07: 12:00:00 via INTRAVENOUS

## 2017-10-07 MED ORDER — ONDANSETRON HCL 4 MG/2ML IJ SOLN
INTRAMUSCULAR | Status: DC | PRN
  Administered 2017-10-07: 4 mg via INTRAVENOUS

## 2017-10-07 MED ORDER — HEMOSTATIC AGENTS (NO CHARGE) OPTIME
TOPICAL | Status: DC | PRN
  Administered 2017-10-07: 1 via TOPICAL

## 2017-10-07 MED ORDER — MIDAZOLAM HCL 5 MG/5ML IJ SOLN
INTRAMUSCULAR | Status: DC | PRN
  Administered 2017-10-07: 2 mg via INTRAVENOUS

## 2017-10-07 MED ORDER — HYDROMORPHONE HCL 1 MG/ML IJ SOLN
INTRAMUSCULAR | Status: AC
  Filled 2017-10-07: qty 1

## 2017-10-07 MED ORDER — KETOROLAC TROMETHAMINE 15 MG/ML IJ SOLN
15.0000 mg | Freq: Four times a day (QID) | INTRAMUSCULAR | Status: DC | PRN
  Administered 2017-10-07 – 2017-10-08 (×2): 15 mg via INTRAVENOUS
  Filled 2017-10-07 (×2): qty 1

## 2017-10-07 MED ORDER — BUPIVACAINE-EPINEPHRINE 0.25% -1:200000 IJ SOLN
INTRAMUSCULAR | Status: AC
  Filled 2017-10-07: qty 1

## 2017-10-07 MED ORDER — FENTANYL CITRATE (PF) 100 MCG/2ML IJ SOLN
INTRAMUSCULAR | Status: DC | PRN
  Administered 2017-10-07 (×3): 50 ug via INTRAVENOUS
  Administered 2017-10-07: 200 ug via INTRAVENOUS

## 2017-10-07 MED ORDER — ROCURONIUM BROMIDE 100 MG/10ML IV SOLN
INTRAVENOUS | Status: DC | PRN
  Administered 2017-10-07: 10 mg via INTRAVENOUS
  Administered 2017-10-07: 40 mg via INTRAVENOUS

## 2017-10-07 MED ORDER — PROPOFOL 10 MG/ML IV BOLUS
INTRAVENOUS | Status: DC | PRN
  Administered 2017-10-07: 130 mg via INTRAVENOUS

## 2017-10-07 MED ORDER — ONDANSETRON HCL 4 MG/2ML IJ SOLN
INTRAMUSCULAR | Status: AC
Start: 2017-10-07 — End: ?
  Filled 2017-10-07: qty 2

## 2017-10-07 MED ORDER — IOPAMIDOL (ISOVUE-300) INJECTION 61%
INTRAVENOUS | Status: AC
  Filled 2017-10-07: qty 50

## 2017-10-07 MED ORDER — LACTATED RINGERS IV SOLN
INTRAVENOUS | Status: DC | PRN
  Administered 2017-10-07 (×2): via INTRAVENOUS

## 2017-10-07 MED ORDER — IOPAMIDOL (ISOVUE-300) INJECTION 61%
INTRAVENOUS | Status: DC | PRN
  Administered 2017-10-07: 10 mL

## 2017-10-07 MED ORDER — BUPIVACAINE-EPINEPHRINE 0.25% -1:200000 IJ SOLN
INTRAMUSCULAR | Status: DC | PRN
  Administered 2017-10-07: 15 mL

## 2017-10-07 MED ORDER — SUGAMMADEX SODIUM 200 MG/2ML IV SOLN
INTRAVENOUS | Status: AC
  Filled 2017-10-07: qty 2

## 2017-10-07 MED ORDER — PROMETHAZINE HCL 25 MG/ML IJ SOLN
6.2500 mg | INTRAMUSCULAR | Status: DC | PRN
  Administered 2017-10-07: 6.25 mg via INTRAVENOUS

## 2017-10-07 MED ORDER — LIDOCAINE HCL (CARDIAC) 20 MG/ML IV SOLN
INTRAVENOUS | Status: AC
  Filled 2017-10-07: qty 5

## 2017-10-07 MED ORDER — FENTANYL CITRATE (PF) 250 MCG/5ML IJ SOLN
INTRAMUSCULAR | Status: AC
  Filled 2017-10-07: qty 5

## 2017-10-07 MED ORDER — SUGAMMADEX SODIUM 200 MG/2ML IV SOLN
INTRAVENOUS | Status: DC | PRN
  Administered 2017-10-07: 350 mg via INTRAVENOUS

## 2017-10-07 MED ORDER — SUGAMMADEX SODIUM 500 MG/5ML IV SOLN
INTRAVENOUS | Status: AC
  Filled 2017-10-07: qty 5

## 2017-10-07 MED ORDER — LIDOCAINE 2% (20 MG/ML) 5 ML SYRINGE
INTRAMUSCULAR | Status: DC | PRN
  Administered 2017-10-07: 60 mg via INTRAVENOUS

## 2017-10-07 MED ORDER — PROMETHAZINE HCL 25 MG/ML IJ SOLN
INTRAMUSCULAR | Status: AC
  Filled 2017-10-07: qty 1

## 2017-10-07 MED ORDER — DEXAMETHASONE SODIUM PHOSPHATE 4 MG/ML IJ SOLN
INTRAMUSCULAR | Status: DC | PRN
  Administered 2017-10-07: 8 mg via INTRAVENOUS

## 2017-10-07 MED ORDER — MEPERIDINE HCL 50 MG/ML IJ SOLN: 6.2500 mg | INTRAMUSCULAR | Status: DC | PRN

## 2017-10-07 MED ORDER — MIDAZOLAM HCL 2 MG/2ML IJ SOLN
INTRAMUSCULAR | Status: AC
  Filled 2017-10-07: qty 2

## 2017-10-07 MED ORDER — ENOXAPARIN SODIUM 40 MG/0.4ML ~~LOC~~ SOLN
40.0000 mg | SUBCUTANEOUS | Status: DC
  Administered 2017-10-08: 40 mg via SUBCUTANEOUS
  Filled 2017-10-07: qty 0.4

## 2017-10-07 MED ORDER — SODIUM CHLORIDE 0.9 % IV SOLN
2.0000 g | INTRAVENOUS | Status: DC
  Filled 2017-10-07: qty 20

## 2017-10-07 MED ORDER — KETOROLAC TROMETHAMINE 30 MG/ML IJ SOLN
INTRAMUSCULAR | Status: AC
  Filled 2017-10-07: qty 1

## 2017-10-07 MED ORDER — ROCURONIUM BROMIDE 10 MG/ML (PF) SYRINGE
PREFILLED_SYRINGE | INTRAVENOUS | Status: AC
  Filled 2017-10-07: qty 5

## 2017-10-07 MED ORDER — DEXAMETHASONE SODIUM PHOSPHATE 10 MG/ML IJ SOLN
INTRAMUSCULAR | Status: AC
  Filled 2017-10-07: qty 1

## 2017-10-07 MED ORDER — HYDROMORPHONE HCL 1 MG/ML IJ SOLN
0.2500 mg | INTRAMUSCULAR | Status: DC | PRN
  Administered 2017-10-07: 0.5 mg via INTRAVENOUS

## 2017-10-07 MED ORDER — 0.9 % SODIUM CHLORIDE (POUR BTL) OPTIME
TOPICAL | Status: DC | PRN
  Administered 2017-10-07: 1000 mL

## 2017-10-07 SURGICAL SUPPLY — 49 items
ADH SKN CLS APL DERMABOND .7 (GAUZE/BANDAGES/DRESSINGS) ×1
APPLIER CLIP 5 13 M/L LIGAMAX5 (MISCELLANEOUS) ×6
APR CLP MED LRG 5 ANG JAW (MISCELLANEOUS) ×2
BAG SPEC RTRVL 10 TROC 200 (ENDOMECHANICALS) ×1
BLADE CLIPPER SURG (BLADE) IMPLANT
CANISTER SUCT 3000ML PPV (MISCELLANEOUS) ×3 IMPLANT
CHLORAPREP W/TINT 26ML (MISCELLANEOUS) ×3 IMPLANT
CLIP APPLIE 5 13 M/L LIGAMAX5 (MISCELLANEOUS) ×1 IMPLANT
COVER MAYO STAND STRL (DRAPES) ×3 IMPLANT
COVER SURGICAL LIGHT HANDLE (MISCELLANEOUS) ×3 IMPLANT
DERMABOND ADVANCED (GAUZE/BANDAGES/DRESSINGS) ×2
DERMABOND ADVANCED .7 DNX12 (GAUZE/BANDAGES/DRESSINGS) ×1 IMPLANT
DEVICE PMI PUNCTURE CLOSURE (MISCELLANEOUS) ×2 IMPLANT
DRAPE C-ARM 42X72 X-RAY (DRAPES) ×3 IMPLANT
ELECT REM PT RETURN 9FT ADLT (ELECTROSURGICAL) ×3
ELECTRODE REM PT RTRN 9FT ADLT (ELECTROSURGICAL) ×1 IMPLANT
FILTER SMOKE EVAC LAPAROSHD (FILTER) ×2 IMPLANT
GLOVE BIO SURGEON STRL SZ8 (GLOVE) ×3 IMPLANT
GLOVE BIOGEL PI IND STRL 8 (GLOVE) ×1 IMPLANT
GLOVE BIOGEL PI INDICATOR 8 (GLOVE) ×2
GOWN STRL REUS W/ TWL LRG LVL3 (GOWN DISPOSABLE) ×2 IMPLANT
GOWN STRL REUS W/ TWL XL LVL3 (GOWN DISPOSABLE) ×1 IMPLANT
GOWN STRL REUS W/TWL LRG LVL3 (GOWN DISPOSABLE) ×6
GOWN STRL REUS W/TWL XL LVL3 (GOWN DISPOSABLE) ×3
HEMOSTAT SNOW SURGICEL 2X4 (HEMOSTASIS) ×2 IMPLANT
KIT BASIN OR (CUSTOM PROCEDURE TRAY) ×3 IMPLANT
KIT ROOM TURNOVER OR (KITS) ×3 IMPLANT
L-HOOK LAP DISP 36CM (ELECTROSURGICAL) ×3
LHOOK LAP DISP 36CM (ELECTROSURGICAL) ×1 IMPLANT
NEEDLE 22X1 1/2 (OR ONLY) (NEEDLE) ×3 IMPLANT
NS IRRIG 1000ML POUR BTL (IV SOLUTION) ×3 IMPLANT
PAD ARMBOARD 7.5X6 YLW CONV (MISCELLANEOUS) ×3 IMPLANT
PENCIL BUTTON HOLSTER BLD 10FT (ELECTRODE) ×3 IMPLANT
POUCH RETRIEVAL ECOSAC 10 (ENDOMECHANICALS) ×1 IMPLANT
POUCH RETRIEVAL ECOSAC 10MM (ENDOMECHANICALS) ×2
SCISSORS LAP 5X35 DISP (ENDOMECHANICALS) ×3 IMPLANT
SET CHOLANGIOGRAPH 5 50 .035 (SET/KITS/TRAYS/PACK) ×3 IMPLANT
SET IRRIG TUBING LAPAROSCOPIC (IRRIGATION / IRRIGATOR) ×3 IMPLANT
SLEEVE ENDOPATH XCEL 5M (ENDOMECHANICALS) ×6 IMPLANT
SPECIMEN JAR SMALL (MISCELLANEOUS) ×3 IMPLANT
SUT VIC AB 4-0 PS2 27 (SUTURE) ×3 IMPLANT
SUT VICRYL 0 UR6 27IN ABS (SUTURE) ×2 IMPLANT
TOWEL OR 17X24 6PK STRL BLUE (TOWEL DISPOSABLE) ×3 IMPLANT
TOWEL OR 17X26 10 PK STRL BLUE (TOWEL DISPOSABLE) ×3 IMPLANT
TRAY LAPAROSCOPIC MC (CUSTOM PROCEDURE TRAY) ×3 IMPLANT
TROCAR XCEL BLUNT TIP 100MML (ENDOMECHANICALS) ×3 IMPLANT
TROCAR XCEL NON-BLD 5MMX100MML (ENDOMECHANICALS) ×3 IMPLANT
TUBING INSUFFLATION (TUBING) ×3 IMPLANT
WATER STERILE IRR 1000ML POUR (IV SOLUTION) ×3 IMPLANT

## 2017-10-07 NOTE — Discharge Instructions (Signed)
CIRUGIA LAPAROSCOPICA: INSTRUCCIONES DE POST OPERATORIO. ° °Revise siempre los documentos que le entreguen en el lugar donde se ha hecho la cirugia. ° °SI USTED NECESITA DOCUMENTOS DE INCAPACIDAD (DISABLE) O DE PERMISO FAMILAR (FAMILY LEAVE) NECESITA TRAERLOS A LA OFICINA PARA QUE SEAN PROCESADOS. °NO  SE LOS DE A SU DOCTOR. °1. A su alta del hospital se le dara una receta para controlar el dolor. Tomela como ha sido recetada, si la necesita. Si no la necesita puede tomar, Acetaminofen (Tylenol) o Ibuprofen (Advil) para aliviar dolor moderado. °2. Continue tomando el resto de sus medicinas. °3. Si necesita rellenar la receta, llame a la farmacia. ellos contactan a nuestra oficina pidiendo autorizacion. Este tipo de receta no pueden ser rellenadas despues de las  5pm o durante los fines de semana. °4. Con relacion a la dieta: debe ser ligera los primeros dias despues que llege a la casa. Ejemplo: sopas y galleticas. Tome bastante liquido esos dias. °5. La mayoria de los pacientes padecen de inflamacion y cambio de coloracion de la piel alrededor de las incisiones. esto toma dias en resolver.  pnerse una bolsa de hielo en el area affectada ayuda..  °6. Es comun tambien tener un poco de estrenimiento si esta tomado medicinas para el dolor. incremente la cantidad de liquidos a tomar y puede tomar (Colace) esto previene el problema. Si ya tiene estrenimiento, es decir no ha defecado en 48 horas, puede tomar un laxativo (Milk of Magnesia or Miralax) uselo como el paquete le explica. °7.  A menos que se le diga algo diferente. Remueva el bendaje a las 24-48 horas despues dela cirugia. y puede banarse en la ducha sin ningun problema. usted puede tener steri-strips (pequenas curitas transparentes en la piel puesta encima de la incision)  Estas banditas strips should be left on the skin for 7-10 days.   Si su cirujano puso pegamento encima de la incision usted puede banarse bajo la ducha en 24 horas. Este pegamento empezara a  caerse en las proximas 2-3 semanas. Si le pusieron suturas o presillas (grapos) estos seran quitados en su proxima cita en la oficina. . °a. ACTIVIDADES:  Puede hacer actividad ligera.  Como caminar , subir escaleras y poco a poco irlas incrementando tanto como las tolere. Puede tener relaciones sexuales cuando sea comfortable. No carge objetos pesados o haga esfuerzos que no sean aprovados por su doctor. °b. Puede manejar en cuanto no esta tomando medicamentos fuertes (narcoticos) para el dolor, pueda abrochar confortablemente el cinturon de seguridad, y pueda maniobrar y usar los pedales de su vehiculo con seguridad. °c. PUEDE REGRESAR A TRABAJAR  °8. Debe ver a su doctor para una cita de seguimiento en 2-3 semanas despues de la cirugia.  °9. OTRAS ISNSTRUCCIONES:___________________________________________________________________________________ °CUANDO LLAMAR A SU MEDICO: °1. FIEBRE mayor de  101.0 °2. No produccion de orina. °3. Sangramiento continue de la herida °4. Incremento de dolor, enrojecimientio o drenaje de la herida (incision) °5. Incremento de dolor abdominal. ° °The clinic staff is available to answer your questions during regular business hours.  Please don’t hesitate to call and ask to speak to one of the nurses for clinical concerns.  If you have a medical emergency, go to the nearest emergency room or call 911.  A surgeon from [facility] is always on call at the hospital. °[address], [address], [place], [place]  [address] ? [address] [address], [place], [place]   [address] °[phone] ? [phone] ? FAX [phone] °Web site: [email or web address] ° ° °

## 2017-10-07 NOTE — Anesthesia Procedure Notes (Signed)
Procedure Name: Intubation Date/Time: 10/07/2017 1:02 PM Performed by: Julian ReilWelty, Ellyse Rotolo F, CRNA Pre-anesthesia Checklist: Patient identified, Emergency Drugs available, Suction available, Patient being monitored and Timeout performed Patient Re-evaluated:Patient Re-evaluated prior to induction Oxygen Delivery Method: Circle system utilized Preoxygenation: Pre-oxygenation with 100% oxygen Induction Type: IV induction Ventilation: Mask ventilation without difficulty Laryngoscope Size: Miller and 3 Grade View: Grade I Tube type: Oral Tube size: 7.5 mm Number of attempts: 1 Airway Equipment and Method: Stylet Placement Confirmation: ETT inserted through vocal cords under direct vision,  positive ETCO2 and breath sounds checked- equal and bilateral Secured at: 22 cm Tube secured with: Tape Dental Injury: Teeth and Oropharynx as per pre-operative assessment  Comments[place].  Pre-op noted upper front two teeth chipped.  None loose per patient with questioning.  4x4s bite block used.

## 2017-10-07 NOTE — Transfer of Care (Signed)
Immediate Anesthesia Transfer of Care Note  Patient: Location Stephanie Maynard  Procedure(s) Performed: LAPAROSCOPIC CHOLECYSTECTOMY WITH INTRAOPERATIVE CHOLANGIOGRAM (N/A Abdomen)  Patient Location: PACU  Anesthesia Type:General  Level of Consciousness: awake and patient cooperative  Airway & Oxygen Therapy: Patient Spontanous Breathing and Patient connected to face mask oxygen  Post-op Assessment: Report given to RN and Post -op Vital signs reviewed and stable  Post vital signs: Reviewed and stable  Last Vitals:  Vitals:   10/07/17 1022 10/07/17 1449  BP: 99/60 127/73  Pulse: 96 99  Resp: 18 (!) 21  Temp: 37.6 C 37.6 C  SpO2: 100% 100%    Last Pain:  Vitals:   10/07/17 1022  TempSrc: Oral  PainSc:          Complications: No apparent anesthesia complications

## 2017-10-07 NOTE — Progress Notes (Signed)
Subjective/Chief Complaint: RUQ pain has improved   Objective: Vital signs in last 24 hours: Temp:  [98.2 F (36.8 C)-102.2 F (39 C)] 98.2 F (36.8 C) (03/18 0600) Pulse Rate:  [84-121] 95 (03/18 0430) Resp:  [17-20] 17 (03/18 0430) BP: (99-124)/(58-83) 114/67 (03/18 0430) SpO2:  [97 %-100 %] 100 % (03/18 0430) Weight:  [90.8 kg (200 lb 2.8 oz)] 90.8 kg (200 lb 2.8 oz) (03/17 2125) Last BM Date: 10/04/17  Intake/Output from previous day: 03/17 0701 - 03/18 0700 In: 1432.5 [I.V.:1432.5] Out: -  Intake/Output this shift: No intake/output data recorded.  General appearance: alert and cooperative Resp: clear to auscultation bilaterally Cardio: regular rate and rhythm GI: soft, mild tenderness RUQ  Lab Results:  Recent Labs    10/06/17 2234 10/07/17 0419  WBC 13.8* 15.2*  HGB 11.2* 11.0*  HCT 34.3* 32.7*  PLT 262 318   BMET Recent Labs    10/06/17 1050 10/06/17 2234 10/07/17 0419  NA 133*  --  131*  K 3.7  --  3.6  CL 99*  --  99*  CO2 22  --  23  GLUCOSE 137*  --  124*  BUN <5*  --  <5*  CREATININE 0.56 0.55 0.49  CALCIUM 8.9  --  8.2*   PT/INR No results for input(s): LABPROT, INR in the last 72 hours. ABG No results for input(s): PHART, HCO3 in the last 72 hours.  Invalid input(s): PCO2, PO2  Studies/Results: Ct Abdomen Pelvis W Contrast  Result Date: 10/06/2017 CLINICAL DATA:  42 year old female with epigastric pain since last night. Vomiting and hematuria. Fever. EXAM: CT ABDOMEN AND PELVIS WITH CONTRAST TECHNIQUE: Multidetector CT imaging of the abdomen and pelvis was performed using the standard protocol following bolus administration of intravenous contrast. CONTRAST:  80 milliliters Omnipaque 300 COMPARISON:  Chest radiographs 07/17/2017. FINDINGS: Lower chest: Minor atelectasis in the right lower lobe. Small calcified granulomas in the right middle lobe. Negative left lung base. No pericardial or pleural effusion. Hepatobiliary: Generalized  hepatic steatosis. Mild hyperenhancement of the liver at the gallbladder fossa. Bulky 3.2 centimeter lamellated gallstone. Diffuse gallbladder wall thickening or pericholecystic edema (coronal image 91). No intra-or extrahepatic biliary ductal enlargement. Pancreas: Negative pancreas.  No pancreatic ductal enlargement. Spleen: Negative. Adrenals/Urinary Tract: Normal adrenal glands. Bilateral renal enhancement and contrast excretion is symmetric and within normal limits. No perinephric stranding. Unremarkable urinary bladder. Stomach/Bowel: Redundant sigmoid colon. Gas-filled but nondilated descending and transverse colon. Retained stool in the right colon. No large bowel wall thickening. The appendix is within normal limits (series 3, image 62). Decompressed and negative terminal ileum. Small lipoma at the ileocecal valve suspected (inconsequential). No dilated small bowel. Decompressed stomach and duodenum. No abdominal free air or free fluid. Vascular/Lymphatic: Major arterial structures in the abdomen and pelvis are patent. There is minimal iliac artery atherosclerosis. Portal venous system appears patent. Reproductive: Negative. Other: Trace pelvic free fluid. Musculoskeletal: Mild lower thoracic spine degeneration. No acute osseous abnormality identified. IMPRESSION: 1. Positive for Acute Cholecystitis. Moderate to severe gallbladder wall thickening and 3.2 cm gallstone in the gallbladder neck. No biliary ductal dilatation. 2. Hepatic steatosis. Electronically Signed   By: Odessa FlemingH  Hall M.D.   On: 10/06/2017 18:58    Anti-infectives: Anti-infectives (From admission, onward)   Start     Dose/Rate Route Frequency Ordered Stop   10/06/17 2015  cefTRIAXone (ROCEPHIN) 2 g in sodium chloride 0.9 % 100 mL IVPB     2 g 200 mL/hr over 30 Minutes Intravenous  Every 24 hours 10/06/17 2008     10/06/17 1800  cefTRIAXone (ROCEPHIN) 2 g in sodium chloride 0.9 % 100 mL IVPB     2 g 200 mL/hr over 30 Minutes Intravenous   Once 10/06/17 1754 10/06/17 1922   10/06/17 1800  metroNIDAZOLE (FLAGYL) IVPB 1 g     1 g 200 mL/hr over 60 Minutes Intravenous  Once 10/06/17 1754 10/06/17 2008      Assessment/Plan: Cholecystitis with cholelithiasis - for laparoscopic cholecystectomy/IOC today. Bili down slightly. Procedure, risks, and benefits again discussed and she agrees.  LOS: 1 day    Liz Malady 10/07/2017

## 2017-10-07 NOTE — Op Note (Signed)
10/06/2017 - 10/07/2017  2:34 PM  PATIENT:  Stephanie Maynard  42 y.o. female  PRE-OPERATIVE DIAGNOSIS:  Cholecystitis with cholelithiasis  POST-OPERATIVE DIAGNOSIS:  Cholecystitis with cholelithiasis  PROCEDURE:  Procedure(s): LAPAROSCOPIC CHOLECYSTECTOMY WITH INTRAOPERATIVE CHOLANGIOGRAM  SURGEON:  Surgeon(s): Violeta Gelinashompson, Mayleigh Tetrault, MD  ASSISTANTS: Wells GuilesKelly Rayburn, PAC   ANESTHESIA:   local and general  EBL:  Total I/O In: 1000 [I.V.:1000] Out: - 75cc  BLOOD ADMINISTERED:none  DRAINS: none   SPECIMEN:  Excision  DISPOSITION OF SPECIMEN:  PATHOLOGY Findings: Large gallstones, severe acute cholecystitis, no common bile duct filling defect on cholangiogram  Procedure in detail: Patient presents for cholecystectomy.  She was identified in the preop holding area.  Informed consent was obtained.  She received intravenous antibiotics.  She was brought to the operating room and general endotracheal anesthesia was administered by the anesthesia staff.  Her abdomen was prepped and draped in a sterile fashion.  We did a timeout procedure.The supraumbilical region was infiltrated with local. Infraumbilical incision was made. Subcutaneous tissues were dissected down revealing the anterior fascia. This was divided sharply along the midline. Peritoneal cavity was entered under direct vision without complication. A 0 Vicryl pursestring was placed around the fascial opening. Hassan trocar was inserted into the abdomen. The abdomen was insufflated with carbon dioxide in standard fashion. Under direct vision a 5 mm epigastric and 5 mm right abdominal port x2 were placed.  Local was used at each port site.  Laparoscopic exploration revealed omentum covering the gallbladder.  This was gently peeled down exposing the infundibulum which was tense and distended.  The Nahzat drainage system was used to evacuate the gallbladder.  The dome was then retracted superior medially.  The infundibulum was  retracted inferior laterally.  Dissection began laterally and progressed medially.  The cystic duct was identified and dissection was continued until a critical view of safety was obtained between the cystic duct, the gallbladder, and the liver.  Once this was done, a clip was placed on the infundibular cystic duct junction.  Small nick was made in the cystic duct.  Cholangiogram catheter was inserted and intraoperative clinic and was obtained.  This demonstrated no filling defects in the common bile duct.  Catheter was removed and 3 clips were placed proximally on the cystic duct.  It was divided.  Further dissection revealed an anterior and posterior branch of the cystic artery.  These were clipped twice proximally and divided with cautery.  There was another small arterial branch which was also clipped twice proximally.  The gallbladder was taken off the liver bed using cautery and achieving excellent hemostasis.  The gallbladder was placed in a bag and removed from the abdomen.  It was sent to pathology.  The liver bed was then cauterized to ensure hemostasis.  A piece of Surgicel snow was applied and there was good hemostasis at this time.  The abdomen was irrigated out.  No other complicating features were seen.  The supraumbilical fascia was then closed using a PMI device under direct vision with interrupted 0 Vicryl sutures.  This achieved an airtight closure.  The pneumoperitoneum was released.  The other ports were removed.  All 4 wounds were irrigated and the skin of each was closed with running 4-0 Vicryl followed by Dermabond.  All counts were correct.  She tolerated the procedure well without apparent complication and was taken recovery in stable condition.  COUNTS:  YES  DICTATION: .Dragon Dictation  PATIENT DISPOSITION:  PACU - hemodynamically stable.   Delay  start of Pharmacological VTE agent (>24hrs) due to surgical blood loss or risk of bleeding:  no  Violeta Gelinas, MD, MPH,  FACS Pager: [phone]  3/18/20192:34 PM

## 2017-10-07 NOTE — Anesthesia Preprocedure Evaluation (Signed)
Anesthesia Evaluation  Patient identified by MRN, date of birth, ID band Patient awake    Reviewed: Allergy & Precautions, NPO status , Patient's Chart, lab work & pertinent test results  Airway Mallampati: I       Dental no notable dental hx. (+) Teeth Intact   Pulmonary neg pulmonary ROS,    Pulmonary exam normal breath sounds clear to auscultation       Cardiovascular negative cardio ROS Normal cardiovascular exam Rhythm:Regular Rate:Normal     Neuro/Psych negative neurological ROS  negative psych ROS   GI/Hepatic negative GI ROS, Neg liver ROS,   Endo/Other  negative endocrine ROS  Renal/GU negative Renal ROS  negative genitourinary   Musculoskeletal negative musculoskeletal ROS (+)   Abdominal Normal abdominal exam  (+)   Peds  Hematology  (+) Blood dyscrasia, anemia ,   Anesthesia Other Findings   Reproductive/Obstetrics                             Anesthesia Physical Anesthesia Plan  ASA: I  Anesthesia Plan: General   Post-op Pain Management:    Induction: Intravenous  PONV Risk Score and Plan: 4 or greater and Ondansetron, Dexamethasone, Scopolamine patch - Pre-op and Midazolam  Airway Management Planned: Oral ETT  Additional Equipment:   Intra-op Plan:   Post-operative Plan: Extubation in OR  Informed Consent: I have reviewed the patients History and Physical, chart, labs and discussed the procedure including the risks, benefits and alternatives for the proposed anesthesia with the patient or authorized representative who has indicated his/her understanding and acceptance.   Dental advisory given  Plan Discussed with: CRNA and Surgeon  Anesthesia Plan Comments:         Anesthesia Quick Evaluation

## 2017-10-08 ENCOUNTER — Encounter (HOSPITAL_COMMUNITY): Payer: Self-pay | Admitting: General Surgery

## 2017-10-08 LAB — CBC
HEMATOCRIT: 30.6 % — AB (ref 36.0–46.0)
Hemoglobin: 10 g/dL — ABNORMAL LOW (ref 12.0–15.0)
MCH: 29.4 pg (ref 26.0–34.0)
MCHC: 32.7 g/dL (ref 30.0–36.0)
MCV: 90 fL (ref 78.0–100.0)
Platelets: 294 10*3/uL (ref 150–400)
RBC: 3.4 MIL/uL — AB (ref 3.87–5.11)
RDW: 14.7 % (ref 11.5–15.5)
WBC: 13.9 10*3/uL — AB (ref 4.0–10.5)

## 2017-10-08 LAB — COMPREHENSIVE METABOLIC PANEL
ALT: 58 U/L — ABNORMAL HIGH (ref 14–54)
ANION GAP: 8 (ref 5–15)
AST: 60 U/L — ABNORMAL HIGH (ref 15–41)
Albumin: 3 g/dL — ABNORMAL LOW (ref 3.5–5.0)
Alkaline Phosphatase: 94 U/L (ref 38–126)
BILIRUBIN TOTAL: 0.7 mg/dL (ref 0.3–1.2)
BUN: <5 mg/dL — ABNORMAL LOW (ref 6–20)
CHLORIDE: 106 mmol/L (ref 101–111)
CO2: 23 mmol/L (ref 22–32)
Calcium: 8.4 mg/dL — ABNORMAL LOW (ref 8.9–10.3)
Creatinine, Ser: 0.47 mg/dL (ref 0.44–1.00)
Glucose, Bld: 130 mg/dL — ABNORMAL HIGH (ref 65–99)
POTASSIUM: 4 mmol/L (ref 3.5–5.1)
Sodium: 137 mmol/L (ref 135–145)
TOTAL PROTEIN: 6.4 g/dL — AB (ref 6.5–8.1)

## 2017-10-08 LAB — HIV ANTIBODY (ROUTINE TESTING W REFLEX): HIV Screen 4th Generation wRfx: NONREACTIVE

## 2017-10-08 MED ORDER — OXYCODONE HCL 5 MG PO TABS: 5.0000 mg | ORAL_TABLET | ORAL | 0 refills | Status: DC | PRN

## 2017-10-08 NOTE — Discharge Summary (Signed)
     Patient ID: Stephanie BankerFrancisca Maynard [id number] [birth year] 42 y.o.  Admit date: 10/06/2017 Discharge date: 10/08/2017  Admitting Diagnosis: cholecystitis  Discharge Diagnosis Patient Active Problem List   Diagnosis Date Noted  . Acute cholecystitis 10/06/2017    Consultants none  Reason for Admission: 42 yo female with 2 days of epigastric abdominal pain. She had one previous bout in December. The pain has been constant. It radiates to her back. It came out of no where. It is not related to food. She has had nausea and vomiting. She denies diarrhea or constipation. She denies fevers.  Procedures Laparoscopic cholecystectomy with IOC by Dr. Janee Mornhompson 10-07-17  Hospital Course:  The patient was admitted and underwent a laparoscopic cholecystectomy with IOC.  The patient tolerated the procedure well.  On POD 1, the patient was tolerating a regular diet, voiding well, mobilizing, and pain was controlled with oral pain medications.  The patient was stable for DC home at this time with appropriate follow up made.  Patient looked up in the [place] Controlled Substances Database    Physical Exam: Gen: NAD Abd: soft, appropriately tender, +BS, ND, incisions c/d/i  Allergies as of 10/08/2017   No Known Allergies     Medication List    STOP taking these medications   pantoprazole 20 MG tablet Commonly known as:  PROTONIX   ranitidine 150 MG capsule Commonly known as:  ZANTAC     TAKE these medications   ibuprofen 200 MG tablet Commonly known as:  ADVIL,MOTRIN Take 200 mg by mouth every 6 (six) hours as needed for moderate pain.   NYQUIL HBP COLD & FLU 15-6.25-325 MG/15ML Liqd Generic drug:  DM-Doxylamine-Acetaminophen Take 15 mLs by mouth at bedtime as needed (sleep).   oxyCODONE 5 MG immediate release tablet Commonly known as:  Oxy IR/ROXICODONE Take 1 tablet (5 mg total) by mouth every 4 (four) hours as needed for moderate pain.        Follow-up  Information    [facility] Go on 10/22/2017.   Specialty:  General Surgery Why:  Your appointment is at 10:30 AM. Please arrive 30 min prior to appointment time. Bring photo ID and insurance information.  Contact information: [address] [place] [address] [phone]           Signed: Barnetta ChapelKelly Sidney Silberman, [facility] 10/08/2017, 9:10 AM Pager: [phone] Consults: [phone] Mon-Fri 7:00 am-4:30 pm Sat-Sun 7:00 am-11:30 am

## 2017-10-08 NOTE — Progress Notes (Signed)
Pt was discharged to go home. Discharge instructions and dr's note for work given.

## 2017-10-08 NOTE — Anesthesia Postprocedure Evaluation (Signed)
Anesthesia Post Note  Patient: Location [name]  Procedure(s) Performed: LAPAROSCOPIC CHOLECYSTECTOMY WITH INTRAOPERATIVE CHOLANGIOGRAM (N/A Abdomen)     Patient location during evaluation: PACU Anesthesia Type: General Level of consciousness: awake and alert Pain management: pain level controlled Vital Signs Assessment: post-procedure vital signs reviewed and stable Respiratory status: spontaneous breathing, nonlabored ventilation and respiratory function stable Cardiovascular status: blood pressure returned to baseline and stable Postop Assessment: no apparent nausea or vomiting Anesthetic complications: no    Last Vitals:  Vitals:   10/08/17 0104 10/08/17 0441  BP: (!) 96/57 102/61  Pulse: 77 81  Resp: 17 18  Temp: 36.7 C 36.9 C  SpO2: 95% 97%    Last Pain:  Vitals:   10/08/17 0633  TempSrc:   PainSc: 0-No pain                 [name],W. EDMOND

## 2018-08-13 IMAGING — CT CT ABD-PELV W/ CM
2 of 5 series · 16 of 46 positions shown, 18 images · IV contrast (omnipaque)
Comparison: Chest radiographs 07/17/2017.

CLINICAL DATA: 41-year-old female with epigastric pain since last
night. Vomiting and hematuria. Fever.

EXAM:
CT ABDOMEN AND PELVIS WITH CONTRAST
TECHNIQUE: Multidetector CT imaging of the abdomen and pelvis was performed
using the standard protocol following bolus administration of
intravenous contrast.
CONTRAST:  80 milliliters Omnipaque 300

[Series 3: a/p w/ 5mm · axial · 0.98mm/px · z∈[+749,+1179]mm · 13 of 96 slices shown, 15 images]
[im 5/96  soft-tissue]
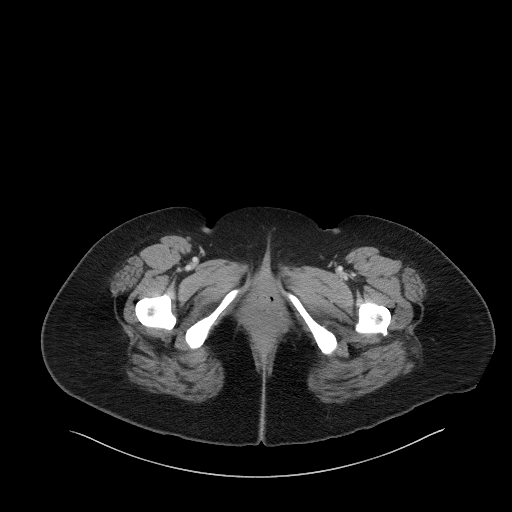
[im 5/96  bone]
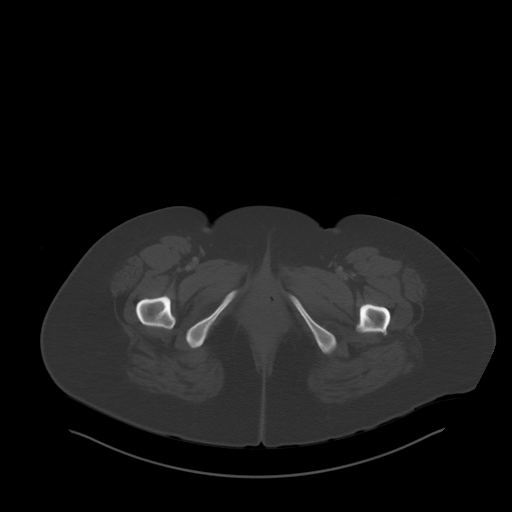
[im 15/96  soft-tissue]
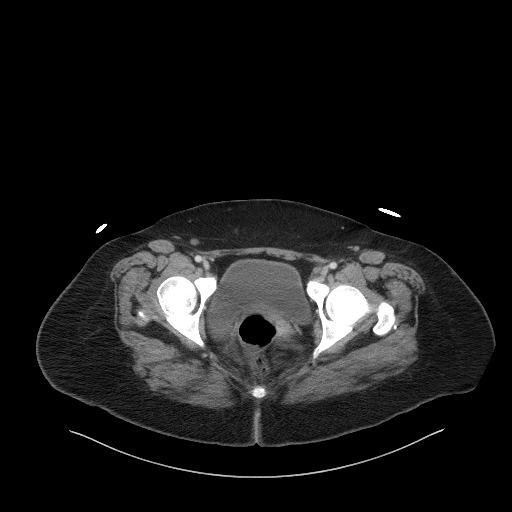
[im 20/96  soft-tissue]
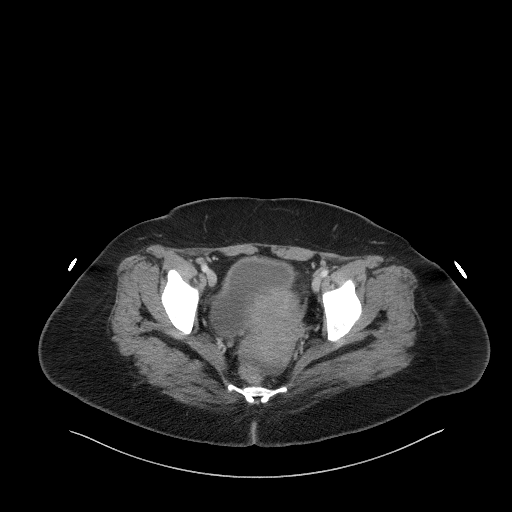
[im 29/96  soft-tissue]
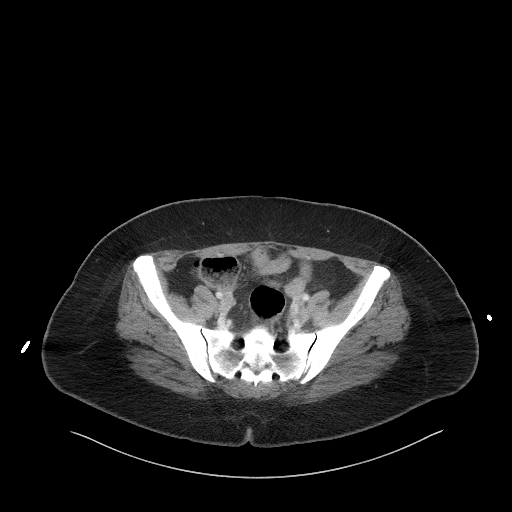
[im 34/96  soft-tissue]
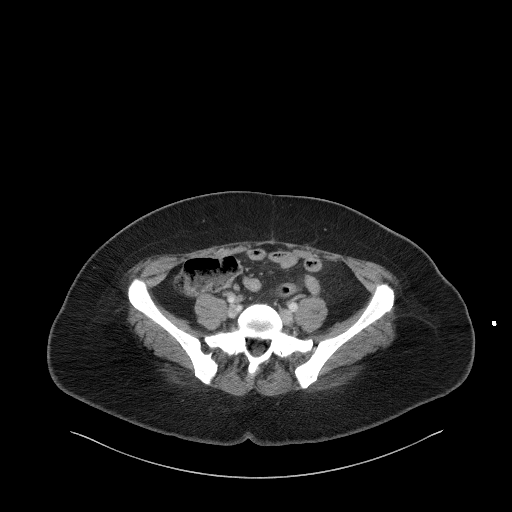
[im 43/96  soft-tissue]
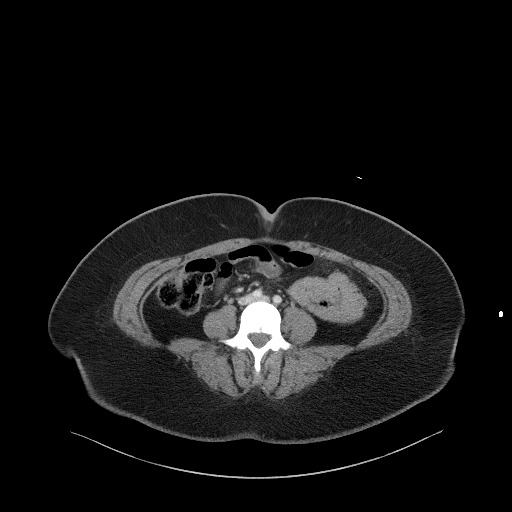
[im 48/96  soft-tissue]
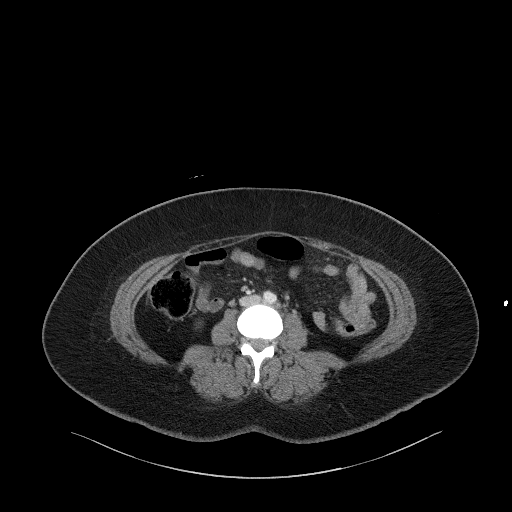
[im 53/96  soft-tissue]
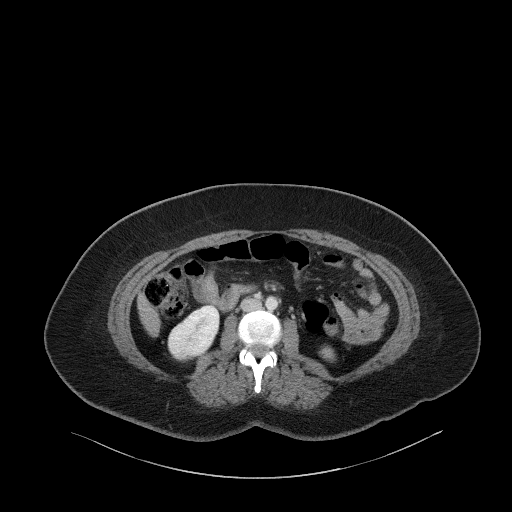
[im 62/96  soft-tissue]
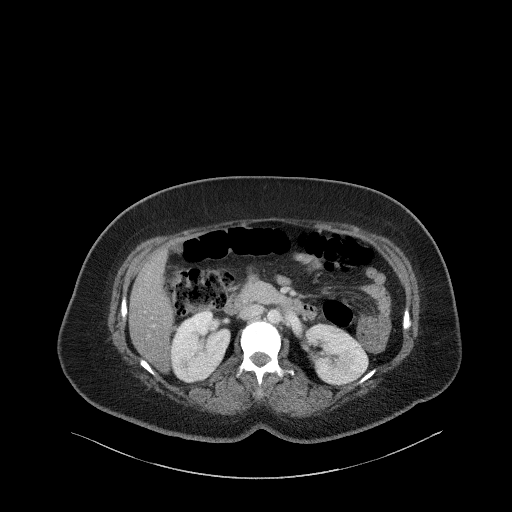
[im 62/96  bone]
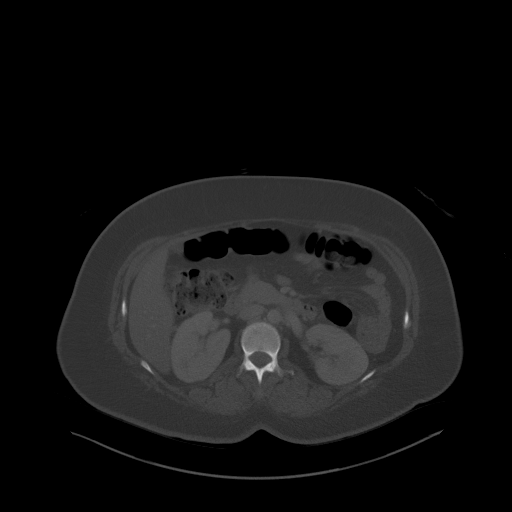
[im 67/96  soft-tissue]
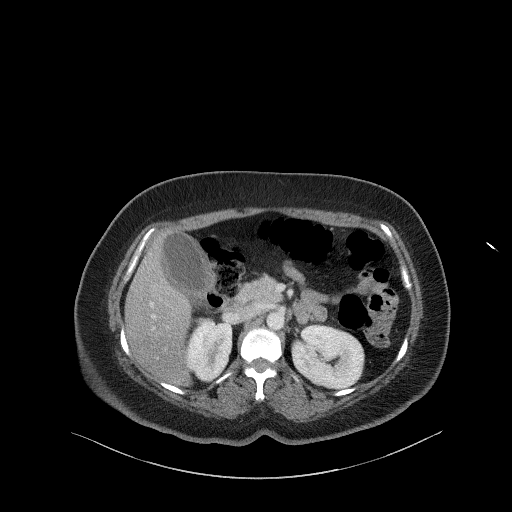
[im 77/96  soft-tissue]
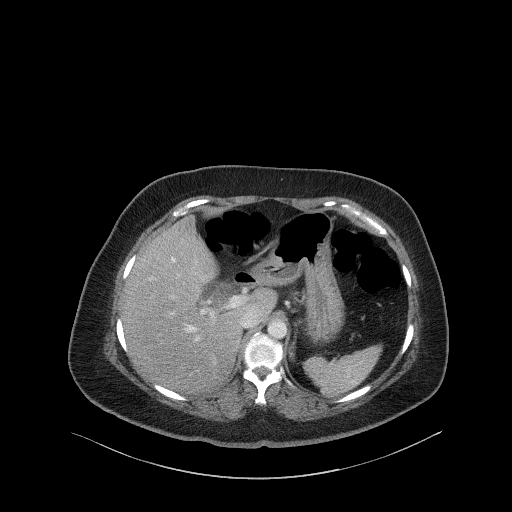
[im 81/96  soft-tissue]
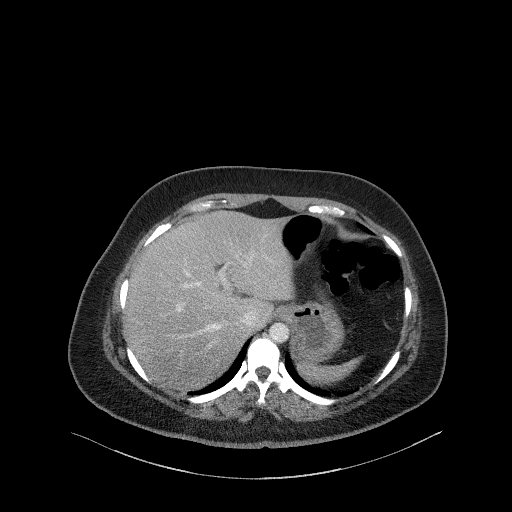
[im 91/96  soft-tissue]
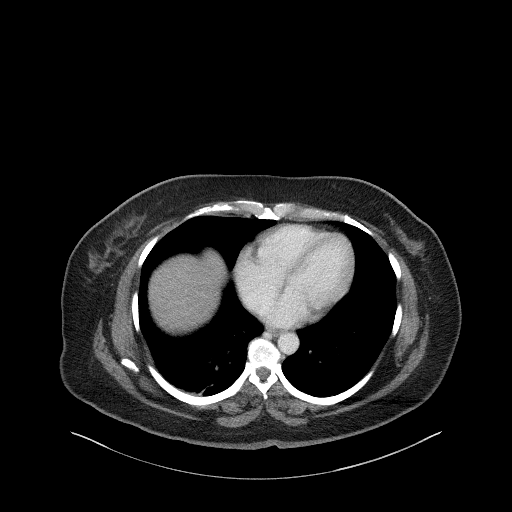

[Series 6: a/p w/ cor · coronal · 0.86mm/px · 3 of 190 slices shown]
[im 64/190  soft-tissue]
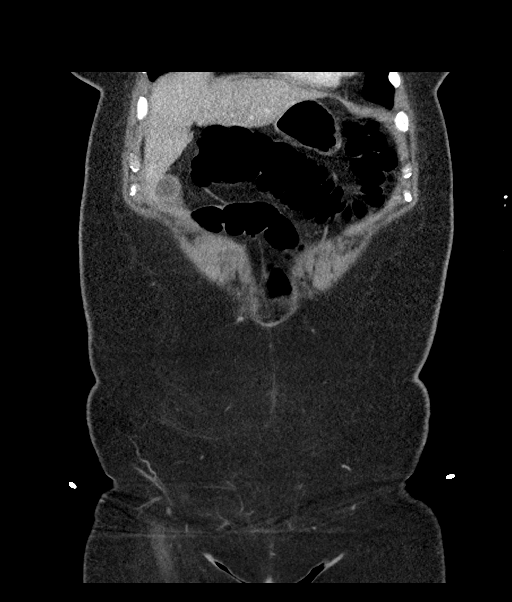
[im 85/190  soft-tissue]
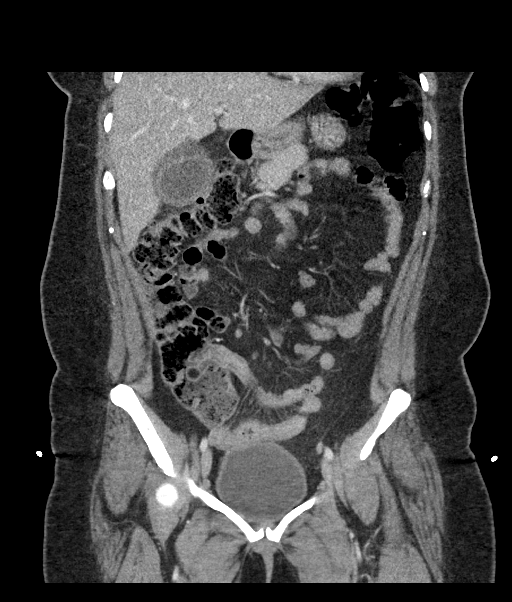
[im 106/190  soft-tissue]
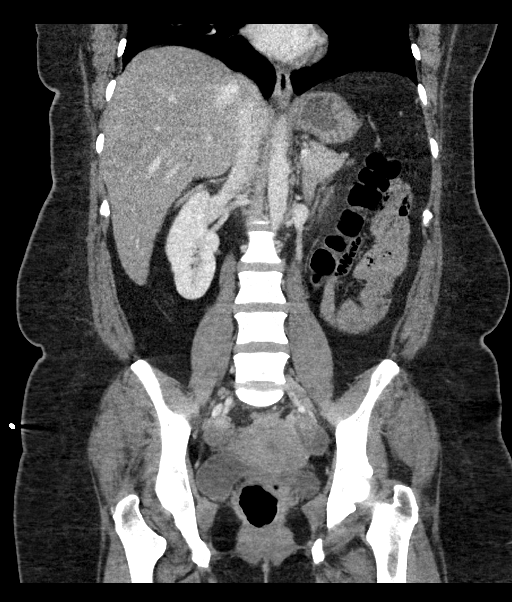

[16 of 46 positions shown; findings below may reference images not displayed]

FINDINGS: Lower chest: Minor atelectasis in the right lower lobe. Small
calcified granulomas in the right middle lobe. Negative left lung
base. No pericardial or pleural effusion.

Hepatobiliary: Generalized hepatic steatosis.

Mild hyperenhancement of the liver at the gallbladder fossa.

Bulky 3.2 centimeter lamellated gallstone. Diffuse gallbladder wall
thickening or pericholecystic edema (coronal image 91). No intra-or
extrahepatic biliary ductal enlargement.

Pancreas: Negative pancreas.  No pancreatic ductal enlargement.

Spleen: Negative.

Adrenals/Urinary Tract: Normal adrenal glands.

Bilateral renal enhancement and contrast excretion is symmetric and
within normal limits. No perinephric stranding.

Unremarkable urinary bladder.

Stomach/Bowel: Redundant sigmoid colon. Gas-filled but nondilated
descending and transverse colon. Retained stool in the right colon.
No large bowel wall thickening. The appendix is within normal limits
(series 3, image 62). Decompressed and negative terminal ileum.
Small lipoma at the ileocecal valve suspected (inconsequential). No
dilated small bowel. Decompressed stomach and duodenum.

No abdominal free air or free fluid.

Vascular/Lymphatic: Major arterial structures in the abdomen and
pelvis are patent. There is minimal iliac artery atherosclerosis.
Portal venous system appears patent.

Reproductive: Negative.

Other: Trace pelvic free fluid.

Musculoskeletal: Mild lower thoracic spine degeneration. No acute
osseous abnormality identified.
IMPRESSION: 1. Positive for Acute Cholecystitis. Moderate to severe gallbladder
wall thickening and 3.2 cm gallstone in the gallbladder neck. No
biliary ductal dilatation.
2. Hepatic steatosis.

## 2018-08-14 IMAGING — RF DG CHOLANGIOGRAM OPERATIVE
1 series · 1 of 1 positions shown · non-contrast
Comparison: CT abdomen and pelvis - 10/06/2017

CLINICAL DATA: Intraoperative cholangiogram during laparoscopic
cholecystectomy.

EXAM:
INTRAOPERATIVE CHOLANGIOGRAM
FLUOROSCOPY TIME:  10 seconds

[Series 1: run · 1 of 1 slices shown]
[im 1/1]
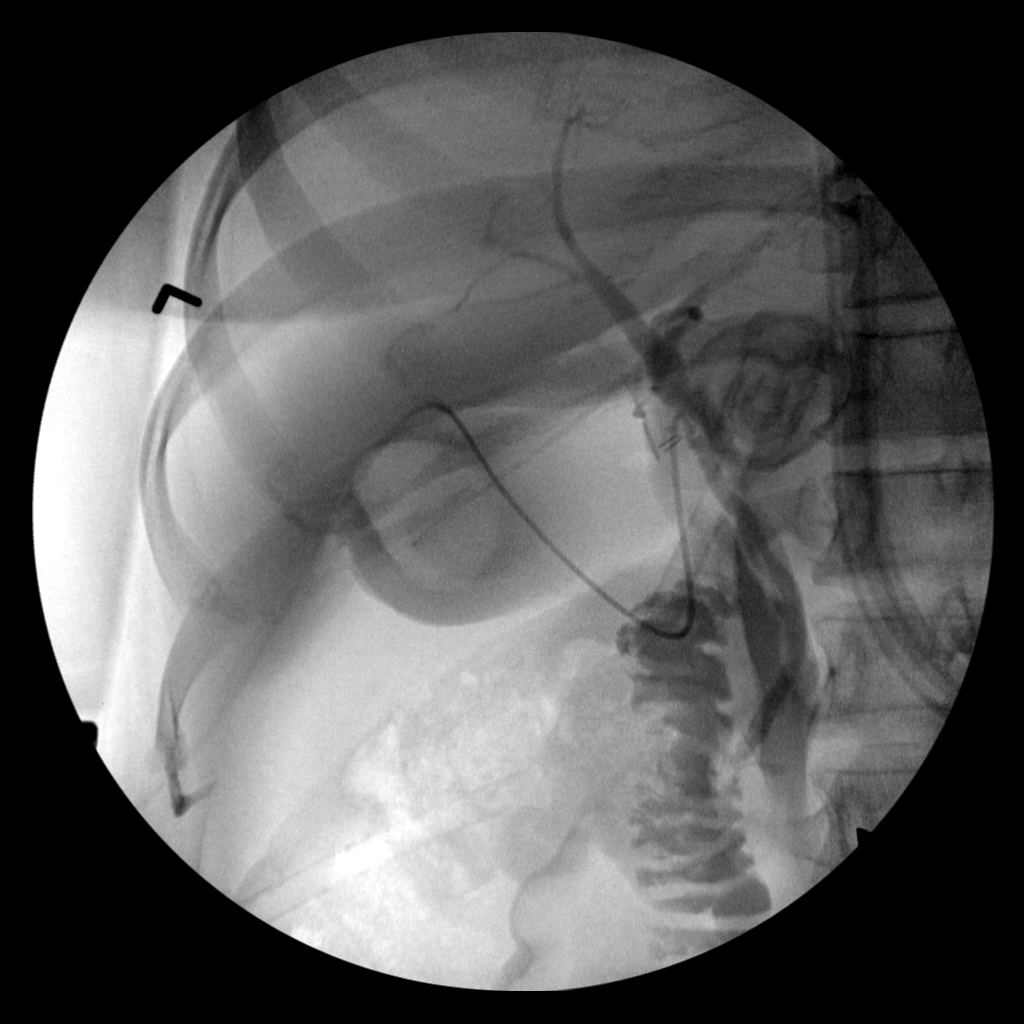

[1 of 1 positions shown; findings below may reference images not displayed]

FINDINGS: A single spot intraoperative cholangiographic image of the right
upper abdominal quadrant during laparoscopic cholecystectomy are
provided for review.

Surgical clips overlie the expected location of the gallbladder
fossa.

Contrast injection demonstrates selective cannulation of the central
aspect of the cystic duct.

There is passage of contrast through the central aspect of the
cystic duct with filling of a non dilated common bile duct. There is
passage of contrast though the CBD and into the descending portion
of the duodenum.

There is minimal reflux of injected contrast into the common hepatic
duct and central aspect of the non dilated intrahepatic biliary
system.

There are no discrete filling defects within the opacified portions
of the biliary system to suggest the presence of
choledocholithiasis.
IMPRESSION: No evidence of choledocholithiasis.

## 2022-12-05 ENCOUNTER — Other Ambulatory Visit: Payer: Self-pay | Admitting: Obstetrics and Gynecology

## 2022-12-05 DIAGNOSIS — Z1231 Encounter for screening mammogram for malignant neoplasm of breast: Secondary | ICD-10-CM

## 2022-12-20 ENCOUNTER — Ambulatory Visit: Payer: Self-pay | Admitting: Hematology and Oncology

## 2022-12-20 ENCOUNTER — Ambulatory Visit
Admission: RE | Admit: 2022-12-20 | Discharge: 2022-12-20 | Disposition: A | Discharge status: Home / Self Care | Payer: No Typology Code available for payment source | Source: Ambulatory Visit | Attending: Obstetrics and Gynecology | Admitting: Obstetrics and Gynecology

## 2022-12-20 VITALS — BP 135/6 | Wt 168.6 lb

## 2022-12-20 DIAGNOSIS — Z1231 Encounter for screening mammogram for malignant neoplasm of breast: Secondary | ICD-10-CM

## 2022-12-20 DIAGNOSIS — Z1211 Encounter for screening for malignant neoplasm of colon: Secondary | ICD-10-CM

## 2022-12-20 NOTE — Patient Instructions (Signed)
Taught Stephanie Maynard about self breast awareness and gave educational materials to take home. Patient did not need a Pap smear today due to last Pap smear was in 11/12/22 per patient.  Let her know [facility] will cover Pap smears every 5 years unless has a history of abnormal Pap smears. Referred patient to the [facility] of [facility] for screening mammogram. Appointment scheduled for 12/20/22. Patient aware of appointment and will be there. Let patient know will follow up with her within the next couple weeks with results. Stephanie Maynard verbalized understanding.  Pascal Lux, NP 11:34 AM

## 2022-12-20 NOTE — Progress Notes (Signed)
[name] is a 47 y.o. female who presents to [facility] clinic today with no complaints.    Pap Smear: Pap not smear completed today. Last Pap smear was 11/12/22 and was normal. Per patient has no history of an abnormal Pap smear. Last Pap smear result is available in Epic.   Physical exam: Breasts Breasts symmetrical. No skin abnormalities bilateral breasts. No nipple retraction bilateral breasts. No nipple discharge bilateral breasts. No lymphadenopathy. No lumps palpated bilateral breasts.       Pelvic/Bimanual Pap is not indicated today    Smoking History: Patient has never smoked and was not referred to quit line.    Patient Navigation: Patient education provided. Access to services provided for patient through [facility] program. Stephanie Maynard interpreter provided. No transportation provided   Colorectal Cancer Screening: Per patient has never had colonoscopy completed No complaints today. FIT test given.    Breast and Cervical Cancer Risk Assessment: Patient does not have family history of breast cancer, known genetic mutations, or radiation treatment to the chest before age 3. Patient does not have history of cervical dysplasia, immunocompromised, or DES exposure in-utero.  Risk Scores as of 12/20/2022     Stephanie Maynard           5-year 0.59 %   Lifetime 7.01 %   This patient is [place] but has no documented birth country, so the [place] model used data from [place] patients to calculate their risk score. Document a birth country in the Demographics activity for a more accurate score.         Last calculated by Stephanie Maynard, CMA on 12/20/2022 at 11:21 AM        A: B[facility]CP exam without pap smear No complaints with benign exam.   P: Referred patient to the [facility] of [facility] for a screening mammogram. Appointment scheduled 12/20/22.  Stephanie Lux, NP 12/20/2022 11:32 AM

## 2022-12-25 ENCOUNTER — Other Ambulatory Visit: Payer: Self-pay | Admitting: Obstetrics and Gynecology

## 2022-12-25 ENCOUNTER — Encounter: Payer: Self-pay | Admitting: Obstetrics and Gynecology

## 2022-12-25 DIAGNOSIS — R928 Other abnormal and inconclusive findings on diagnostic imaging of breast: Secondary | ICD-10-CM

## 2022-12-30 LAB — FECAL OCCULT BLOOD, IMMUNOCHEMICAL: Fecal Occult Bld: NEGATIVE

## 2023-01-10 ENCOUNTER — Ambulatory Visit
Admission: RE | Admit: 2023-01-10 | Discharge: 2023-01-10 | Disposition: A | Discharge status: Home / Self Care | Payer: No Typology Code available for payment source | Source: Ambulatory Visit | Attending: Obstetrics and Gynecology | Admitting: Obstetrics and Gynecology

## 2023-01-10 ENCOUNTER — Ambulatory Visit: Payer: No Typology Code available for payment source

## 2023-01-10 DIAGNOSIS — R928 Other abnormal and inconclusive findings on diagnostic imaging of breast: Secondary | ICD-10-CM

## 2023-01-16 ENCOUNTER — Other Ambulatory Visit: Payer: Self-pay

## 2023-11-20 ENCOUNTER — Other Ambulatory Visit: Payer: Self-pay | Admitting: Obstetrics and Gynecology

## 2023-11-20 DIAGNOSIS — Z1231 Encounter for screening mammogram for malignant neoplasm of breast: Secondary | ICD-10-CM

## 2024-02-03 ENCOUNTER — Telehealth: Payer: Self-pay

## 2024-02-03 NOTE — Telephone Encounter (Signed)
 Patient telephoned, used interpreter#[id number]. Patient requested B[facility] office address. Patient received text message with appointment instructions and needed clarification.

## 2024-02-04 ENCOUNTER — Ambulatory Visit: Payer: Self-pay | Admitting: *Deleted

## 2024-02-04 ENCOUNTER — Ambulatory Visit
Admission: RE | Admit: 2024-02-04 | Discharge: 2024-02-04 | Disposition: A | Discharge status: Home / Self Care | Payer: Self-pay | Source: Ambulatory Visit | Attending: Obstetrics and Gynecology | Admitting: Obstetrics and Gynecology

## 2024-02-04 VITALS — BP 116/82 | Wt 175.9 lb

## 2024-02-04 DIAGNOSIS — Z1211 Encounter for screening for malignant neoplasm of colon: Secondary | ICD-10-CM

## 2024-02-04 DIAGNOSIS — Z1231 Encounter for screening mammogram for malignant neoplasm of breast: Secondary | ICD-10-CM

## 2024-02-04 DIAGNOSIS — Z1239 Encounter for other screening for malignant neoplasm of breast: Secondary | ICD-10-CM

## 2024-02-04 NOTE — Patient Instructions (Signed)
 Explained breast self awareness with [name]. Patient did not need a Pap smear today due to last Pap smear and HPV typing was 11/12/2022. Let her know BCCCP will cover Pap smears and HPV typing every 5 years unless has a history of abnormal Pap smears. Referred patient to the [facility] Center of [facility] for a screening mammogram on mobile unit. Appointment scheduled Tuesday, February 04, 2024 at 1330. Patient aware of appointment and will be there. Let patient know the Breast Center will follow up with her within the next couple weeks with results of her mammogram by letter or phone. Stephanie Maynard verbalized understanding.  Ardit Danh, Wanda Ship, RN 1:42 PM

## 2024-02-04 NOTE — Progress Notes (Signed)
 [name] is a 48 y.o. female who presents to [facility] clinic today with no complaints.    Pap Smear: Pap smear not completed today. Last Pap smear was 11/12/2022 at the [facility] clinic and was normal with negative HPV. Per patient has no history of an abnormal Pap smear. Last Pap smear result is available in Epic.   Physical exam: Breasts Breasts symmetrical. No skin abnormalities bilateral breasts. No nipple retraction bilateral breasts. No nipple discharge bilateral breasts. No lymphadenopathy. No lumps palpated bilateral breasts. No complaints of pain or tenderness on exam.  MS 3D DIAG MAMMO UNI LT BR (aka MM) Result Date: 01/10/2023 CLINICAL DATA:  Screening recall from baseline mammography for possible left breast asymmetry. EXAM: DIGITAL DIAGNOSTIC UNILATERAL LEFT MAMMOGRAM WITH TOMOSYNTHESIS TECHNIQUE: Left digital diagnostic mammography and breast tomosynthesis was performed. COMPARISON:  Previous exam(s). ACR Breast Density Category c: The breasts are heterogeneously dense, which may obscure small masses. FINDINGS: Additional tomograms were performed of the left breast. The initially questioned possible left breast asymmetry resolves on the additional imaging with findings compatible with an area of overlapping fibroglandular tissue. There is no mammographic evidence of malignancy in the left breast. IMPRESSION: No mammographic evidence of malignancy in the left breast. RECOMMENDATION: Screening mammogram in one year.(Code:SM-B-01Y) I have discussed the findings and recommendations with the patient. If applicable, a reminder letter will be sent to the patient regarding the next appointment. BI-RADS CATEGORY  1: Negative. Electronically Signed   By: Delon Music M.D.   On: 01/10/2023 10:44  MS 3D SCR MAMMO BILAT BR (aka MM) Result Date: 12/24/2022 CLINICAL DATA:  Screening. EXAM: DIGITAL SCREENING BILATERAL MAMMOGRAM WITH TOMOSYNTHESIS AND CAD  TECHNIQUE: Bilateral screening digital craniocaudal and mediolateral oblique mammograms were obtained. Bilateral screening digital breast tomosynthesis was performed. The images were evaluated with computer-aided detection. COMPARISON:  None available. ACR Breast Density Category c: The breasts are heterogeneously dense, which may obscure small masses. FINDINGS: In the left breast, a possible asymmetry warrants further evaluation. In the right breast, no findings suspicious for malignancy. IMPRESSION: Further evaluation is suggested for possible asymmetry in the left breast. RECOMMENDATION: Diagnostic mammogram and possibly ultrasound of the left breast. (Code:[id number]-89M) The patient will be contacted regarding the findings, and additional imaging will be scheduled. BI-RADS CATEGORY  0: Incomplete: Need additional imaging evaluation. Electronically Signed   By: Inocente Ast M.D.   On: 12/24/2022 13:23        Pelvic/Bimanual Pap is not indicated today per BCCCP guidelines.   Smoking History: Patient has never smoked.   Patient Navigation: Patient education provided. Access to services provided for patient through [place] program. Spanish interpreter Bernice Angry from [facility] provided.   Colorectal Cancer Screening: Per patient has never had colonoscopy completed. Patient completed a FIT Test 12/25/2022 that was negative. FIT Test given to patient today to complete. No complaints today.    Breast and Cervical Cancer Risk Assessment: Patient does not have family history of breast cancer, known genetic mutations, or radiation treatment to the chest before age 63. Patient does not have history of cervical dysplasia, immunocompromised, or DES exposure in-utero.  Risk Scores as of Encounter on 02/04/2024     Stephanie Maynard           5-year 0.61%   Lifetime 6.91%   This patient is [place]/Lat[place] but has no documented birth country, so the [place] model used data from [place] patients to calculate their risk score.  Document a birth country in the Demographics  activity for a more accurate score.         Last calculated by Logan Lyle BRAVO, CMA on 02/04/2024 at  1:29 PM        A: BCCCP exam without pap smear No complaints.  P: Referred patient to the [facility] of [facility] for a screening mammogram on mobile unit. Appointment scheduled Tuesday, February 04, 2024 at 1330.  Driscilla Wanda SQUIBB, RN 02/04/2024 1:42 PM

## 2024-02-11 LAB — FECAL OCCULT BLOOD, IMMUNOCHEMICAL: Fecal Occult Bld: NEGATIVE

## 2024-02-13 ENCOUNTER — Ambulatory Visit: Payer: Self-pay
